# Patient Record
Sex: Female | Born: 1965
Health system: Southern US, Community
[De-identification: ages and names within clinical notes are randomized; demographics above are authoritative.]

## PROBLEM LIST (undated history)

## (undated) DIAGNOSIS — F329 Major depressive disorder, single episode, unspecified: Secondary | ICD-10-CM

## (undated) DIAGNOSIS — E669 Obesity, unspecified: Secondary | ICD-10-CM

## (undated) DIAGNOSIS — T7840XA Allergy, unspecified, initial encounter: Secondary | ICD-10-CM

## (undated) DIAGNOSIS — D649 Anemia, unspecified: Secondary | ICD-10-CM

## (undated) DIAGNOSIS — E079 Disorder of thyroid, unspecified: Secondary | ICD-10-CM

## (undated) DIAGNOSIS — G4730 Sleep apnea, unspecified: Secondary | ICD-10-CM

## (undated) DIAGNOSIS — G43909 Migraine, unspecified, not intractable, without status migrainosus: Secondary | ICD-10-CM

## (undated) DIAGNOSIS — N6019 Diffuse cystic mastopathy of unspecified breast: Secondary | ICD-10-CM

## (undated) DIAGNOSIS — E785 Hyperlipidemia, unspecified: Secondary | ICD-10-CM

## (undated) DIAGNOSIS — F419 Anxiety disorder, unspecified: Secondary | ICD-10-CM

## (undated) DIAGNOSIS — M797 Fibromyalgia: Secondary | ICD-10-CM

## (undated) DIAGNOSIS — E039 Hypothyroidism, unspecified: Secondary | ICD-10-CM

## (undated) DIAGNOSIS — H269 Unspecified cataract: Secondary | ICD-10-CM

## (undated) DIAGNOSIS — M1990 Unspecified osteoarthritis, unspecified site: Secondary | ICD-10-CM

## (undated) DIAGNOSIS — F32A Depression, unspecified: Secondary | ICD-10-CM

## (undated) DIAGNOSIS — K219 Gastro-esophageal reflux disease without esophagitis: Secondary | ICD-10-CM

## (undated) DIAGNOSIS — M810 Age-related osteoporosis without current pathological fracture: Secondary | ICD-10-CM

## (undated) HISTORY — DX: Gastro-esophageal reflux disease without esophagitis: K21.9

## (undated) HISTORY — PX: WISDOM TOOTH EXTRACTION: SHX21

## (undated) HISTORY — DX: Hyperlipidemia, unspecified: E78.5

## (undated) HISTORY — DX: Depression, unspecified: F32.A

## (undated) HISTORY — DX: Allergy, unspecified, initial encounter: T78.40XA

## (undated) HISTORY — DX: Diffuse cystic mastopathy of unspecified breast: N60.19

## (undated) HISTORY — DX: Unspecified cataract: H26.9

## (undated) HISTORY — DX: Anxiety disorder, unspecified: F41.9

## (undated) HISTORY — DX: Unspecified osteoarthritis, unspecified site: M19.90

## (undated) HISTORY — DX: Age-related osteoporosis without current pathological fracture: M81.0

## (undated) HISTORY — DX: Sleep apnea, unspecified: G47.30

## (undated) HISTORY — DX: Disorder of thyroid, unspecified: E07.9

## (undated) HISTORY — DX: Anemia, unspecified: D64.9

## (undated) HISTORY — PX: TONSILLECTOMY AND ADENOIDECTOMY: SUR1326

## (undated) HISTORY — DX: Fibromyalgia: M79.7

## (undated) HISTORY — DX: Major depressive disorder, single episode, unspecified: F32.9

## (undated) HISTORY — DX: Migraine, unspecified, not intractable, without status migrainosus: G43.909

## (undated) HISTORY — PX: GASTRIC BYPASS: SHX52

## (undated) HISTORY — DX: Obesity, unspecified: E66.9

---

## 1999-12-04 ENCOUNTER — Other Ambulatory Visit: Admission: RE | Admit: 1999-12-04 | Discharge: 1999-12-04 | Payer: Self-pay | Admitting: *Deleted

## 2000-12-08 ENCOUNTER — Other Ambulatory Visit: Admission: RE | Admit: 2000-12-08 | Discharge: 2000-12-08 | Payer: Self-pay | Admitting: *Deleted

## 2001-12-12 ENCOUNTER — Other Ambulatory Visit: Admission: RE | Admit: 2001-12-12 | Discharge: 2001-12-12 | Payer: Self-pay | Admitting: *Deleted

## 2002-03-03 ENCOUNTER — Ambulatory Visit (HOSPITAL_COMMUNITY): Admission: RE | Admit: 2002-03-03 | Discharge: 2002-03-03 | Payer: Self-pay | Admitting: Internal Medicine

## 2002-09-08 ENCOUNTER — Encounter: Payer: Self-pay | Admitting: Emergency Medicine

## 2002-09-08 ENCOUNTER — Emergency Department (HOSPITAL_COMMUNITY): Admission: EM | Admit: 2002-09-08 | Discharge: 2002-09-08 | Payer: Self-pay | Admitting: Emergency Medicine

## 2002-09-14 HISTORY — PX: LAPAROSCOPIC CHOLECYSTECTOMY: SUR755

## 2002-09-27 ENCOUNTER — Observation Stay (HOSPITAL_COMMUNITY): Admission: RE | Admit: 2002-09-27 | Discharge: 2002-09-28 | Payer: Self-pay | Admitting: *Deleted

## 2002-09-27 ENCOUNTER — Encounter (INDEPENDENT_AMBULATORY_CARE_PROVIDER_SITE_OTHER): Payer: Self-pay | Admitting: Specialist

## 2002-09-27 ENCOUNTER — Encounter: Payer: Self-pay | Admitting: *Deleted

## 2002-12-01 ENCOUNTER — Other Ambulatory Visit: Admission: RE | Admit: 2002-12-01 | Discharge: 2002-12-01 | Payer: Self-pay | Admitting: *Deleted

## 2003-12-13 ENCOUNTER — Other Ambulatory Visit: Admission: RE | Admit: 2003-12-13 | Discharge: 2003-12-13 | Payer: Self-pay | Admitting: *Deleted

## 2004-05-15 ENCOUNTER — Encounter: Admission: RE | Admit: 2004-05-15 | Discharge: 2004-08-13 | Payer: Self-pay | Admitting: Nurse Practitioner

## 2005-01-27 ENCOUNTER — Other Ambulatory Visit: Admission: RE | Admit: 2005-01-27 | Discharge: 2005-01-27 | Payer: Self-pay | Admitting: *Deleted

## 2005-03-16 ENCOUNTER — Encounter: Admission: RE | Admit: 2005-03-16 | Discharge: 2005-03-16 | Payer: Self-pay | Admitting: *Deleted

## 2006-01-18 ENCOUNTER — Other Ambulatory Visit: Admission: RE | Admit: 2006-01-18 | Discharge: 2006-01-18 | Payer: Self-pay | Admitting: Obstetrics and Gynecology

## 2006-03-19 ENCOUNTER — Encounter: Admission: RE | Admit: 2006-03-19 | Discharge: 2006-03-19 | Payer: Self-pay | Admitting: Obstetrics and Gynecology

## 2007-01-21 ENCOUNTER — Other Ambulatory Visit: Admission: RE | Admit: 2007-01-21 | Discharge: 2007-01-21 | Payer: Self-pay | Admitting: Obstetrics and Gynecology

## 2007-03-21 ENCOUNTER — Encounter: Admission: RE | Admit: 2007-03-21 | Discharge: 2007-03-21 | Payer: Self-pay | Admitting: Obstetrics & Gynecology

## 2007-09-15 HISTORY — PX: HERNIA REPAIR: SHX51

## 2008-01-31 ENCOUNTER — Other Ambulatory Visit: Admission: RE | Admit: 2008-01-31 | Discharge: 2008-01-31 | Payer: Self-pay | Admitting: Obstetrics & Gynecology

## 2008-03-21 ENCOUNTER — Encounter: Admission: RE | Admit: 2008-03-21 | Discharge: 2008-03-21 | Payer: Self-pay | Admitting: Obstetrics and Gynecology

## 2008-07-09 ENCOUNTER — Ambulatory Visit: Payer: Self-pay | Admitting: Vascular Surgery

## 2009-03-22 ENCOUNTER — Encounter: Admission: RE | Admit: 2009-03-22 | Discharge: 2009-03-22 | Payer: Self-pay | Admitting: Obstetrics and Gynecology

## 2010-04-02 ENCOUNTER — Encounter: Admission: RE | Admit: 2010-04-02 | Discharge: 2010-04-02 | Payer: Self-pay | Admitting: Obstetrics and Gynecology

## 2010-09-02 LAB — HM DEXA SCAN

## 2011-01-27 NOTE — Procedures (Signed)
DUPLEX DEEP VENOUS EXAM - LOWER EXTREMITY   INDICATION:  Right lower extremity lump with fever.   HISTORY:  Edema:  No.  Trauma/Surgery:  No.  Pain:  No.  PE:  No.  Previous DVT:  No.  Anticoagulants:  No.  Other:  Right lump on thigh for approximately one year.   DUPLEX EXAM:                CFV   SFV   PopV  PTV    GSV                R  L  R  L  R  L  R   L  R  L  Thrombosis    o  o  o     o     o      o  Spontaneous   +  +  +     +     +      +  Phasic        +  +  +     +     +      +  Augmentation  +  +  +     +     +      +  Compressible  +  +  +     +     +      +  Competent     +  +  +     +     +      +   Legend:  + - yes  o - no  p - partial  D - decreased    IMPRESSION:  1. No evidence of deep venous thrombosis or superficial venous      thrombosis in the right lower extremity deep or the left common      femoral vein.  2. Incidental note:  Evidence of nonvascularized lump in the right      thigh measuring 1.79 cm X 3.39 cm.       _____________________________  Janetta Hora. Fields, MD   AS/MEDQ  D:  07/09/2008  T:  07/09/2008  Job:  [id number]

## 2011-01-30 NOTE — Op Note (Signed)
NAME:  Katherine Todd, Katherine Todd                       ACCOUNT NO.:  [id number]   MEDICAL RECORD NO.:  [id number]                   PATIENT TYPE:  OBV   LOCATION:  0349                                 FACILITY:  [facility]   PHYSICIAN:  Maisie Fus B. Samuella Cota, M.D.               DATE OF BIRTH:  1966-01-21   DATE OF PROCEDURE:  09/27/2002  DATE OF DISCHARGE:                                 OPERATIVE REPORT   CCS#:  [id number]   PREOPERATIVE DIAGNOSES:  Chronic cholecystitis with cholelithiasis.   POSTOPERATIVE DIAGNOSES:  Chronic cholecystitis with cholelithiasis.   OPERATION PERFORMED:  Laparoscopic cholecystectomy with operative  cholangiogram.   SURGEON:  Maisie Fus B. Samuella Cota, M.D.   ANESTHESIA:  General, anesthesiologist and CRNA.   DESCRIPTION OF PROCEDURE:  The patient was taken to the operating room and  placed on the table in supine position. After satisfactory general  anesthetic with intubation the entire abdomen was prepped and draped in a  sterile field. A 0.25% Marcaine without epinephrine was injected at the  infraumbilical area. A vertical incision was made through the skin and  subcutaneous tissue. The patient was quite thick and the fascia was finally  exposed and opened and a finger was placed into the peritoneal cavity. A  pursestring suture of #0 Vicryl was placed and the Hasson trocar was placed  into the abdomen and the abdomen was insufflated to 14 mmHg. A second 10 mm  trocar was placed just to the right of midline in the subxiphoid area. Two 5  mm trocars were placed laterally. The patient's gallbladder was placed on  traction, there were minimal adhesions to the lower part of the gallbladder  which were taken down. The cystic duct was identified and seen to be normal  size. There was a small artery coming off superiorly which was doubly  clipped on the remaining side and once on the gallbladder side and divided.  After the cystic duct had been well exposed, the cystic duct was  clipped on  the gallbladder side. A small opening was made into the cystic duct. There  seemed to be some debris which was compressed and removed. [facility]  cholangiocath was introduced through a stab wound in the right upper  quadrant of the abdomen. The catheter was placed into the cystic duct and  held in place with an Endoclip. Using real-time C-arm fluoroscopy,  cholangiogram was carried out. There was good filling of the entire biliary  system with good spillage of the contrast into the duodenum. The cystic duct  appeared fairly long. After the cholangiogram had been done, the  cholangiocath was removed and the cystic duct was triply clipped on the  remaining side and divided. The cystic artery was then readily seen which  was triply clipped on the remaining side, once on the gallbladder side and  divided. The gallbladder was dissected from the bed using the cautery. There  was another  artery higher up which was doubly clipped on the remaining side  and divided. The gallbladder was freed from the liver and there was no  bleeding of the gallbladder bed. The gallbladder was then easily removed  through the infraumbilical incision. She seemed to have one stone which was  about 5 mm in size. The right upper quadrant was copiously irrigated and the  irrigant was clear. The two lateral trocars were removed under direct  vision. The pursestring suture at the infraumbilical incision was tied but  there still seemed to be a slight fascial weakness so a figure-of-eight  suture was placed into the fascia to get good closure. This seemed to  produce a good closure. The two lateral trocars were removed under direct  vision. The abdomen was deflated and a second 10 mm trocar was removed. All  trocar sites had been injected with 0.25% Marcaine without epinephrine. The  two midline incisions were closed with running subcuticular sutures of 4-0  Monocryl and the two lateral trocar sites were  closed  with single simple inverted subcuticular 4-0 Monocryl. Benzoin and  1/2 inch Steri-Strips were used to reinforce the skin closure. Dry sterile  dressings were applied. The patient seemed to tolerate the procedure well  and was taken to the PACU in satisfactory condition.                                               Thomas B. Samuella Cota, M.D.    TBP/MEDQ  D:  09/27/2002  T:  09/27/2002  Job:  [id number]   cc:   Barry Dienes. Eloise Harman, M.D.  [id number]  [place]  [place] [address]  Fax: [phone]

## 2011-01-30 NOTE — Consult Note (Signed)
NAME:  Katherine Todd, Katherine Todd                       ACCOUNT NO.:  [id number]   MEDICAL RECORD NO.:  [id number]                   PATIENT TYPE:  EMS   LOCATION:  MAJO                                 FACILITY:  [facility]   PHYSICIAN:  Thornton Park. Daphine Deutscher, M.D.             DATE OF BIRTH:  05-Jan-1966   DATE OF CONSULTATION:  DATE OF DISCHARGE:                                   CONSULTATION   CHIEF COMPLAINT:  Mid-abdominal pain off and on over the last several weeks  beginning December 4-5 and then Christmas Eve and Christmas Day.  The pain  is in the midepigastrium radiating into her back.   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  She had an attack on  December 24 and December 25 and presented to the emergency room.  A workup  was undertaken, which I have reviewed, including her ultrasound and labs.  Her hemoglobin is 13.9 with a white count of 6100 with a normal  differential.  Her electrolytes are normal.  Creatinine 1.  Liver function  studies are all in normal limits including and alkaline phosphatase of 95  and a total bilirubin of 0.6.  Amylase was normal at 40, lipase was normal  at 29.  X-rays showed at least one stone in the neck of her gallbladder but  no wall thickening and no pericholecystic fluid was noted.   PAST MEDICAL HISTORY:  Remarkable for depression, fibromyalgia, sleep apnea.  She is followed by Dr. Jarome Matin.   PAST SURGICAL HISTORY:  A tonsillectomy.   MEDICATIONS:  She has multiple medications including:  1. Voltaren 75 mg h.s.  2. Prilosec q.d.  3. Wellbutrin 300 mg q.d.  4. Prozac 20 mg 3 in the morning.  5. Provigil q.d.  6. Serzone 200 mg b.i.d.  7. Topamax 100 mg h.s.  8. Lipitor 20 mg q.o.d.  9. One other medicine that I cannot read.   PHYSICAL EXAMINATION:  GENERAL:  A slightly overweight white female in no  acute distress.  HEENT:  No scleral icterus.  LUNGS:  Clear.  HEART:  Sinus rhythm.  ABDOMEN:  There is no rebound or guarding.  She had some mild tenderness  apparently during her gallbladder ultrasound but none is localized now.    IMPRESSION:  Symptomatic gallstones.  Recommend laparoscopic  cholecystectomy, probable intraoperative cholangiogram electively.  We will  give her our office number to call about setting up for an appointment for a  laparoscopic cholecystectomy.                                               Thornton Park Daphine Deutscher, M.D.    MBM/MEDQ  D:  09/08/2002  T:  09/09/2002  Job:  [id number]

## 2011-03-02 ENCOUNTER — Other Ambulatory Visit: Payer: Self-pay | Admitting: Obstetrics and Gynecology

## 2011-03-02 DIAGNOSIS — Z1231 Encounter for screening mammogram for malignant neoplasm of breast: Secondary | ICD-10-CM

## 2011-04-06 ENCOUNTER — Ambulatory Visit: Payer: Self-pay

## 2011-04-10 ENCOUNTER — Ambulatory Visit
Admission: RE | Admit: 2011-04-10 | Discharge: 2011-04-10 | Disposition: A | Discharge status: Home / Self Care | Payer: 59 | Source: Ambulatory Visit | Attending: Obstetrics and Gynecology | Admitting: Obstetrics and Gynecology

## 2011-04-10 DIAGNOSIS — Z1231 Encounter for screening mammogram for malignant neoplasm of breast: Secondary | ICD-10-CM

## 2011-04-14 ENCOUNTER — Other Ambulatory Visit: Payer: Self-pay | Admitting: Obstetrics and Gynecology

## 2011-04-14 DIAGNOSIS — R928 Other abnormal and inconclusive findings on diagnostic imaging of breast: Secondary | ICD-10-CM

## 2011-04-21 ENCOUNTER — Ambulatory Visit
Admission: RE | Admit: 2011-04-21 | Discharge: 2011-04-21 | Disposition: A | Discharge status: Home / Self Care | Payer: 59 | Source: Ambulatory Visit | Attending: Obstetrics and Gynecology | Admitting: Obstetrics and Gynecology

## 2011-04-21 DIAGNOSIS — R928 Other abnormal and inconclusive findings on diagnostic imaging of breast: Secondary | ICD-10-CM

## 2012-02-25 LAB — HM PAP SMEAR: HM Pap smear: NEGATIVE

## 2012-03-22 ENCOUNTER — Other Ambulatory Visit: Payer: Self-pay | Admitting: Obstetrics and Gynecology

## 2012-03-22 DIAGNOSIS — Z1231 Encounter for screening mammogram for malignant neoplasm of breast: Secondary | ICD-10-CM

## 2012-04-11 ENCOUNTER — Ambulatory Visit
Admission: RE | Admit: 2012-04-11 | Discharge: 2012-04-11 | Disposition: A | Discharge status: Home / Self Care | Payer: 59 | Source: Ambulatory Visit | Attending: Obstetrics and Gynecology | Admitting: Obstetrics and Gynecology

## 2012-04-11 DIAGNOSIS — Z1231 Encounter for screening mammogram for malignant neoplasm of breast: Secondary | ICD-10-CM

## 2012-04-13 ENCOUNTER — Other Ambulatory Visit: Payer: Self-pay | Admitting: Obstetrics and Gynecology

## 2012-04-13 DIAGNOSIS — R928 Other abnormal and inconclusive findings on diagnostic imaging of breast: Secondary | ICD-10-CM

## 2012-04-18 ENCOUNTER — Ambulatory Visit
Admission: RE | Admit: 2012-04-18 | Discharge: 2012-04-18 | Disposition: A | Discharge status: Home / Self Care | Payer: 59 | Source: Ambulatory Visit | Attending: Obstetrics and Gynecology | Admitting: Obstetrics and Gynecology

## 2012-04-18 DIAGNOSIS — R928 Other abnormal and inconclusive findings on diagnostic imaging of breast: Secondary | ICD-10-CM

## 2012-05-06 DIAGNOSIS — G43009 Migraine without aura, not intractable, without status migrainosus: Secondary | ICD-10-CM | POA: Insufficient documentation

## 2013-01-25 ENCOUNTER — Telehealth: Payer: Self-pay | Admitting: Nurse Practitioner

## 2013-01-25 NOTE — Telephone Encounter (Signed)
She called for a specific prescription for low estro.

## 2013-01-26 NOTE — Telephone Encounter (Signed)
Spoke with pt about refill of lo loestrin FE. Pt has Aex scheduled for 02-28-13 and needs a refill to get her through until then. [facility] on [address] and [address]

## 2013-01-27 MED ORDER — NORETHIN-ETH ESTRAD-FE BIPHAS 1 MG-10 MCG / 10 MCG PO TABS: 1.0000 | ORAL_TABLET | Freq: Every day | ORAL | Status: DC

## 2013-01-27 NOTE — Telephone Encounter (Signed)
Rx refill for Lo Loestrin FE sent to [facility]

## 2013-01-27 NOTE — Telephone Encounter (Signed)
Ok to refill X 2 months until AEX.

## 2013-02-21 ENCOUNTER — Encounter: Payer: Self-pay | Admitting: *Deleted

## 2013-02-28 ENCOUNTER — Encounter: Payer: Self-pay | Admitting: Nurse Practitioner

## 2013-02-28 ENCOUNTER — Ambulatory Visit (INDEPENDENT_AMBULATORY_CARE_PROVIDER_SITE_OTHER): Payer: BC Managed Care – PPO | Admitting: Nurse Practitioner

## 2013-02-28 VITALS — BP 124/60 | HR 72 | Resp 14 | Ht 65.0 in | Wt 183.0 lb

## 2013-02-28 DIAGNOSIS — Z01419 Encounter for gynecological examination (general) (routine) without abnormal findings: Secondary | ICD-10-CM

## 2013-02-28 MED ORDER — NORETHIN-ETH ESTRAD-FE BIPHAS 1 MG-10 MCG / 10 MCG PO TABS: 1.0000 | ORAL_TABLET | Freq: Every day | ORAL | Status: DC

## 2013-02-28 NOTE — Patient Instructions (Signed)

## 2013-02-28 NOTE — Progress Notes (Signed)
47 y.o. G0P0 Single Caucasian Fe here for annual exam.  Usually no spotting on Lo- Loestrin and really likes this OCP.  No vaso symptoms. She is not sexually active. She is now working at [employer] after leaving job at [employer].  Patient's last menstrual period was 10/27/2010.          Sexually active: no  The current method of family planning is none.    Exercising: no  The patient does not participate in regular exercise at present. Smoker:  no  Health Maintenance: Pap:  02/25/2012  Normal with negative HR HPV MMG:  04/18/2012 normal Colonoscopy:  never BMD:   09/02/2010 normal TDaP:  01/2010 Labs: PCP monitors labs (blood work). Pt denied urine analysis.    reports that she has never smoked. She has never used smokeless tobacco. She reports that she does not drink alcohol or use illicit drugs.  Past Medical History  Diagnosis Date  . Depression   . Fibrocystic breast   . Fibromyalgia   . Sleep apnea   . Obesity   . Migraines     Past Surgical History  Procedure Laterality Date  . Wisdom tooth extraction    . Laparoscopic cholecystectomy    . Gastric bypass    . Hernia repair      incisional repair    Current Outpatient Prescriptions  Medication Sig Dispense Refill  . ARIPiprazole (ABILIFY) 2 MG tablet Take 2 mg by mouth daily.      . calcium carbonate (TUMS - DOSED IN MG ELEMENTAL CALCIUM) 500 MG chewable tablet Chew 1 tablet by mouth 2 (two) times daily.      . cetirizine (ZYRTEC) 10 MG tablet Take 10 mg by mouth daily.      . clonazePAM (KLONOPIN) 0.5 MG tablet Take 0.5 mg by mouth 2 (two) times daily as needed for anxiety.      Marland Kitchen eletriptan (RELPAX) 40 MG tablet One tablet by mouth at onset of headache. May repeat in 2 hours if headache persists or recurs. may repeat in 2 hours if necessary      . fenofibrate 160 MG tablet Take 160 mg by mouth daily.      . ferrous fumarate (HEMOCYTE - 106 MG FE) 325 (106 FE) MG TABS Take 1 tablet by mouth daily.      Marland Kitchen  lovastatin (MEVACOR) 20 MG tablet Take 20 mg by mouth once a week.      . Multiple Vitamin (MULTIVITAMIN) tablet Take 1 tablet by mouth daily.      . Norethindrone-Ethinyl Estradiol-Fe Biphas (LO LOESTRIN FE) 1 MG-10 MCG / 10 MCG tablet Take 1 tablet by mouth daily.  2 Package  0  . omeprazole (PRILOSEC) 20 MG capsule Take 20 mg by mouth 2 (two) times daily.      Marland Kitchen venlafaxine XR (EFFEXOR-XR) 150 MG 24 hr capsule Take 150 mg by mouth daily.      . Vitamin D, Ergocalciferol, (DRISDOL) 50000 UNITS CAPS Take 50,000 Units by mouth every 7 (seven) days.      Marland Kitchen zonisamide (ZONEGRAN) 100 MG capsule Take 100 mg by mouth daily.       No current facility-administered medications for this visit.    Family History  Problem Relation Age of Onset  . Cancer Maternal Grandfather     colon caner    ROS:  Pertinent items are noted in HPI.  Otherwise, a comprehensive ROS was negative.  Exam:   BP 124/60  Pulse 72  Resp 14  Ht 5\' 5"  (1.651 m)  Wt 183 lb (83.008 kg)  BMI 30.45 kg/m2  LMP 10/27/2010 Height: 5\' 5"  (165.1 cm)  Ht Readings from Last 3 Encounters:  02/28/13 5\' 5"  (1.651 m)    General appearance: alert, cooperative and appears stated age Head: Normocephalic, without obvious abnormality, atraumatic Neck: no adenopathy, supple, symmetrical, trachea midline and thyroid normal to inspection and palpation Lungs: clear to auscultation bilaterally Breasts: normal appearance, no masses or tenderness Heart: regular rate and rhythm Abdomen: soft, non-tender; no masses,  no organomegaly Extremities: extremities normal, atraumatic, no cyanosis or edema Skin: Skin color, texture, turgor normal. No rashes or lesions Lymph nodes: Cervical, supraclavicular, and axillary nodes normal. No abnormal inguinal nodes palpated Neurologic: Grossly normal   Pelvic: External genitalia:  no lesions              Urethra:  normal appearing urethra with no masses, tenderness or lesions              Bartholin's  and Skene's: normal                 Vagina: normal appearing vagina with normal color and discharge, no lesions              Cervix: anteverted              Pap taken: no Bimanual Exam:  Uterus:  normal size, contour, position, consistency, mobility, non-tender              Adnexa: no mass, fullness, tenderness               Rectovaginal: Confirms               Anus:  normal sphincter tone, no lesions  A:  Well Woman with normal exam  S/P mini gastric bypass 04/2007 with revision in 02/2010  Peri menopausal  P:   Pap smear as per guidelines   Mammogram due 04/2013  counseled on adequate intake of calcium and vitamin D,   diet and exercise, Kegel's exercises return annually or prn  An After Visit Summary was printed and given to the patient.

## 2013-03-02 NOTE — Progress Notes (Signed)
Encounter reviewed by Dr. Brook Silva.  

## 2013-03-22 ENCOUNTER — Other Ambulatory Visit: Payer: Self-pay | Admitting: Certified Nurse Midwife

## 2013-03-23 NOTE — Telephone Encounter (Signed)
eScribe request for refill on LoLoestrin Last filled - 02/28/13 x 1 year Last AEX - 02/28/13 RX was refilled at [facility]. RX denied.

## 2013-03-27 ENCOUNTER — Other Ambulatory Visit: Payer: Self-pay

## 2013-03-27 DIAGNOSIS — Z1231 Encounter for screening mammogram for malignant neoplasm of breast: Secondary | ICD-10-CM

## 2013-04-19 ENCOUNTER — Ambulatory Visit
Admission: RE | Admit: 2013-04-19 | Discharge: 2013-04-19 | Disposition: A | Discharge status: Home / Self Care | Payer: BC Managed Care – PPO | Source: Ambulatory Visit

## 2013-04-19 DIAGNOSIS — Z1231 Encounter for screening mammogram for malignant neoplasm of breast: Secondary | ICD-10-CM

## 2013-12-14 ENCOUNTER — Other Ambulatory Visit: Payer: Self-pay | Admitting: Nurse Practitioner

## 2013-12-19 ENCOUNTER — Telehealth: Payer: Self-pay | Admitting: Nurse Practitioner

## 2013-12-19 NOTE — Telephone Encounter (Signed)
Last AEX 02/28/2013 Next appt 03/02/2014  Last refill 02/28/2013 #3 packs/3 refills - Called pharmacy and pt has enough refill until 02/2014 - Patient notified.

## 2013-12-19 NOTE — Telephone Encounter (Signed)
Patient needs refill of Norethindrone-Ethinyl Estradiol-Fe Biphas (LO LOESTRIN FE) 1 MG-10 MCG / 10 MCG tablet  Take 1 tablet by mouth daily., Starting 02/28/2013, Until Discontinued, Print, Last Dose: Not Recorded  Refills: 3 ordered Pharmacy: [id number] - [place], [address] AT [address]

## 2014-01-22 ENCOUNTER — Encounter: Payer: Self-pay | Admitting: Nurse Practitioner

## 2014-03-02 ENCOUNTER — Ambulatory Visit: Payer: BC Managed Care – PPO | Admitting: Nurse Practitioner

## 2014-03-04 ENCOUNTER — Other Ambulatory Visit: Payer: Self-pay | Admitting: Nurse Practitioner

## 2014-03-05 NOTE — Telephone Encounter (Signed)
Last AEX 02/28/13 AEX scheduled for 03/20/14 Lo Loestrin #84/0refills sent to pharmacy to last patient until AEX

## 2014-03-20 ENCOUNTER — Ambulatory Visit (INDEPENDENT_AMBULATORY_CARE_PROVIDER_SITE_OTHER): Payer: BC Managed Care – PPO | Admitting: Nurse Practitioner

## 2014-03-20 ENCOUNTER — Encounter: Payer: Self-pay | Admitting: Nurse Practitioner

## 2014-03-20 ENCOUNTER — Other Ambulatory Visit: Payer: Self-pay

## 2014-03-20 VITALS — BP 110/74 | HR 64 | Ht 64.75 in | Wt 170.0 lb

## 2014-03-20 DIAGNOSIS — Z01419 Encounter for gynecological examination (general) (routine) without abnormal findings: Secondary | ICD-10-CM

## 2014-03-20 DIAGNOSIS — E559 Vitamin D deficiency, unspecified: Secondary | ICD-10-CM

## 2014-03-20 DIAGNOSIS — Z1231 Encounter for screening mammogram for malignant neoplasm of breast: Secondary | ICD-10-CM

## 2014-03-20 MED ORDER — NORETHIN-ETH ESTRAD-FE BIPHAS 1 MG-10 MCG / 10 MCG PO TABS: ORAL_TABLET | ORAL | Status: DC

## 2014-03-20 NOTE — Patient Instructions (Signed)

## 2014-03-20 NOTE — Progress Notes (Signed)
Patient ID: Katherine Todd, female   DOB: 06-20-1966, 48 y.o.   MRN: [id number] 48 y.o. G0P0 Single Caucasian Fe here for annual exam.  No new health problems. menses is regular and light for 2-3 days .  Remains not SA.  Recent Vit D was  29 about 2 weeks ago.  PCP is managing.  Her goal weight is 150 lb.  Since last year has lost 13 lbs.  Since the gastric bypass in 8/08 and revision 02/2010 her weight was 306 lbs. intially and now at 170 lbs. = 136 lbs total weight loss.  Patient's last menstrual period was 10/27/2010.          Sexually active: No.  The current method of family planning is OCP (estrogen/progesterone).    Exercising: no The patient does not participate in regular exercise at present.  Smoker: no   Health Maintenance:  Pap: 02/25/2012 Normal with negative HR HPV  MMG: 04/19/13, Bi-Rads 2: benign findings BMD: 2013, [facility], normal per patient TDaP: 01/2010  Labs: PCP monitors labs (blood work).   reports that she has never smoked. She has never used smokeless tobacco. She reports that she does not drink alcohol or use illicit drugs.  Past Medical History  Diagnosis Date  . Depression   . Fibrocystic breast   . Fibromyalgia   . Sleep apnea   . Obesity   . Migraines     Past Surgical History  Procedure Laterality Date  . Wisdom tooth extraction  age 54  . Laparoscopic cholecystectomy  09/2002    Dr. March Rummage  . Gastric bypass  04/2007, revision 02/2010    "Mini" gastric bypass  . Hernia repair  2009    incisional repair from gastsric bypass  . Tonsillectomy and adenoidectomy  age  58    Current Outpatient Prescriptions  Medication Sig Dispense Refill  . ARIPiprazole (ABILIFY) 5 MG tablet Take 2.5 mg by mouth daily.      Marland Kitchen BRINTELLIX 20 MG TABS Take 20 mg by mouth daily.      Marland Kitchen buPROPion (WELLBUTRIN XL) 300 MG 24 hr tablet Take 1 tablet by mouth daily.      . calcium carbonate (TUMS - DOSED IN MG ELEMENTAL CALCIUM) 500 MG chewable tablet Chew 1 tablet by mouth  2 (two) times daily.      . cetirizine (ZYRTEC) 10 MG tablet Take 10 mg by mouth daily.      . clonazePAM (KLONOPIN) 0.5 MG tablet Take 0.5 mg by mouth 2 (two) times daily as needed for anxiety.      Marland Kitchen eletriptan (RELPAX) 40 MG tablet One tablet by mouth at onset of headache. May repeat in 2 hours if headache persists or recurs. may repeat in 2 hours if necessary      . fenofibrate 160 MG tablet Take 160 mg by mouth daily.      . ferrous fumarate (HEMOCYTE - 106 MG FE) 325 (106 FE) MG TABS Take 1 tablet by mouth daily.      Marland Kitchen lovastatin (MEVACOR) 20 MG tablet Take 20 mg by mouth once a week.      . meloxicam (MOBIC) 15 MG tablet Take 1 tablet by mouth daily.      . Multiple Vitamin (MULTIVITAMIN) tablet Take 1 tablet by mouth daily.      . Norethindrone-Ethinyl Estradiol-Fe Biphas (LO LOESTRIN FE) 1 MG-10 MCG / 10 MCG tablet TAKE 1 TABLET BY MOUTH EVERY DAY  3 Package  3  . Vitamin  D, Ergocalciferol, (DRISDOL) 50000 UNITS CAPS Take 50,000 Units by mouth. Five days per week      . zonisamide (ZONEGRAN) 100 MG capsule Take 100 mg by mouth daily.       No current facility-administered medications for this visit.    Family History  Problem Relation Age of Onset  . Colon cancer Maternal Grandfather     colon caner  . Hyperlipidemia Mother   . Prostate cancer Father 38  . Thyroid disease Father   . Melanoma Brother 35    back of knee     ROS:  Pertinent items are noted in HPI.  Otherwise, a comprehensive ROS was negative.  Exam:   BP 110/74  Pulse 64  Ht 5' 4.75" (1.645 m)  Wt 170 lb (77.111 kg)  BMI 28.50 kg/m2  LMP 10/27/2010 Height: 5' 4.75" (164.5 cm)  Ht Readings from Last 3 Encounters:  03/20/14 5' 4.75" (1.645 m)  02/28/13 5\' 5"  (1.651 m)    General appearance: alert, cooperative and appears stated age Head: Normocephalic, without obvious abnormality, atraumatic Neck: no adenopathy, supple, symmetrical, trachea midline and thyroid normal to inspection and palpation Lungs:  clear to auscultation bilaterally Breasts: normal appearance, no masses or tenderness Heart: regular rate and rhythm Abdomen: soft, non-tender; no masses,  no organomegaly Extremities: extremities normal, atraumatic, no cyanosis or edema Skin: Skin color, texture, turgor normal. No rashes or lesions Lymph nodes: Cervical, supraclavicular, and axillary nodes normal. No abnormal inguinal nodes palpated Neurologic: Grossly normal   Pelvic: External genitalia:  no lesions              Urethra:  normal appearing urethra with no masses, tenderness or lesions              Bartholin's and Skene's: normal                 Vagina: normal appearing vagina with normal color and discharge, no lesions              Cervix: anteverted              Pap taken: No. Bimanual Exam:  Uterus:  normal size, contour, position, consistency, mobility, non-tender              Adnexa: no mass, fullness, tenderness               Rectovaginal: Confirms               Anus:  normal sphincter tone, no lesions  A:  Well Woman with normal exam  OCP for cycle regulation  S/P gastric bypass 8/08 with revision 02/2010  History of depression    P:   Reviewed health and wellness pertinent to exam  Pap smear not taken today  Mammogram is due 04/2014  Refill OCP Lo Loestrin for a year  Counseled on breast self exam, mammography screening, adequate intake of calcium and vitamin D, diet and exercise return annually or prn  An After Visit Summary was printed and given to the patient.

## 2014-03-21 NOTE — Progress Notes (Signed)
Encounter reviewed by Dr. Dain Laseter Silva.  

## 2014-04-20 ENCOUNTER — Ambulatory Visit
Admission: RE | Admit: 2014-04-20 | Discharge: 2014-04-20 | Disposition: A | Discharge status: Home / Self Care | Payer: BC Managed Care – PPO | Source: Ambulatory Visit

## 2014-04-20 ENCOUNTER — Encounter (INDEPENDENT_AMBULATORY_CARE_PROVIDER_SITE_OTHER): Payer: Self-pay

## 2014-04-20 DIAGNOSIS — Z1231 Encounter for screening mammogram for malignant neoplasm of breast: Secondary | ICD-10-CM

## 2014-10-25 ENCOUNTER — Telehealth: Payer: Self-pay

## 2014-10-25 NOTE — Telephone Encounter (Signed)
Spoke with Lattie Haw at [facility] to initiate prior authorization for [employer] Form to be faxed to office.

## 2014-10-26 NOTE — Telephone Encounter (Signed)
Prior authorization form to Milford Cage, FNP for review and signature before fax.

## 2014-10-29 NOTE — Telephone Encounter (Signed)
Reviewed and signed

## 2014-10-30 NOTE — Telephone Encounter (Signed)
Prior authorization faxed to [facility] at [phone] with fax cover sheet and confirmation.

## 2014-11-01 NOTE — Telephone Encounter (Signed)
Prior authorization for Lo Loestrin Fe was approved from 10/30/2014-09/13/2038. Spoke with patient and advised of approval. Patient is agreeable. Approval to Milford Cage, FNP for review and signature for scan.  Routing to provider for final review. Patient agreeable to disposition. Will close encounter

## 2014-12-27 ENCOUNTER — Telehealth: Payer: Self-pay | Admitting: Nurse Practitioner

## 2014-12-27 NOTE — Telephone Encounter (Signed)
Left message regarding upcoming appointment has been canceled and needs to be rescheduled. °

## 2015-03-15 ENCOUNTER — Other Ambulatory Visit: Payer: Self-pay

## 2015-03-15 DIAGNOSIS — Z1231 Encounter for screening mammogram for malignant neoplasm of breast: Secondary | ICD-10-CM

## 2015-03-28 ENCOUNTER — Ambulatory Visit: Payer: BC Managed Care – PPO | Admitting: Nurse Practitioner

## 2015-04-02 ENCOUNTER — Ambulatory Visit (INDEPENDENT_AMBULATORY_CARE_PROVIDER_SITE_OTHER): Payer: BLUE CROSS/BLUE SHIELD | Admitting: Nurse Practitioner

## 2015-04-02 ENCOUNTER — Encounter: Payer: Self-pay | Admitting: Nurse Practitioner

## 2015-04-02 VITALS — BP 122/70 | HR 80 | Resp 16 | Ht 64.5 in | Wt 168.0 lb

## 2015-04-02 DIAGNOSIS — Z0000 Encounter for general adult medical examination without abnormal findings: Secondary | ICD-10-CM | POA: Diagnosis not present

## 2015-04-02 DIAGNOSIS — Z01419 Encounter for gynecological examination (general) (routine) without abnormal findings: Secondary | ICD-10-CM

## 2015-04-02 LAB — POCT URINALYSIS DIPSTICK
Bilirubin, UA: NEGATIVE
Blood, UA: NEGATIVE
Glucose, UA: NEGATIVE
Ketones, UA: NEGATIVE
Leukocytes, UA: NEGATIVE
Nitrite, UA: NEGATIVE
Protein, UA: NEGATIVE
Urobilinogen, UA: NEGATIVE
pH, UA: 5

## 2015-04-02 MED ORDER — NORETHIN-ETH ESTRAD-FE BIPHAS 1 MG-10 MCG / 10 MCG PO TABS: ORAL_TABLET | ORAL | Status: DC

## 2015-04-02 NOTE — Patient Instructions (Signed)

## 2015-04-02 NOTE — Progress Notes (Signed)
Patient ID: Katherine Todd, female   DOB: Nov 05, 1965, 49 y.o.   MRN: [id number] 49 y.o. G0P0 Single  Caucasian Fe here for annual exam.  New partner for 4 months.  (This is her first partner ever).  Since being SA she is having pain at the introitus  Maybe the next day has some soreness internally.  She is having spotting with SA that sometimes requires a mini pad and maybe last for 1 day.  She attributes this to friction rub.   No bladder or urinary symptoms.  She remains of on OCP without a cycle since 2012.  Partner who is a physician states no history of STD's for him declines testing.  Patient's last menstrual period was 10/27/2010.          Sexually active: Yes.    The current method of family planning is OCP (estrogen/progesterone) and post menopausal status.    Exercising: No.  The patient does not participate in regular exercise at present. Smoker:  no  Health Maintenance: Pap:  02/25/12: Negative MMG:  04/20/14, 3D, Bi-Rads 1:  Negative (scheduled for 04/24/15) DEXA: 2011 TDaP:  01/12/10 Labs:  PCP [id number]    reports that she has never smoked. She has never used smokeless tobacco. She reports that she does not drink alcohol or use illicit drugs.  Past Medical History  Diagnosis Date  . Depression   . Fibrocystic breast   . Fibromyalgia   . Sleep apnea   . Obesity   . Migraines     Past Surgical History  Procedure Laterality Date  . Wisdom tooth extraction  age 3  . Laparoscopic cholecystectomy  09/2002    Dr. March Rummage  . Gastric bypass  04/2007, revision 02/2010    "Mini" gastric bypass  . Hernia repair  2009    incisional repair from gastsric bypass  . Tonsillectomy and adenoidectomy  age  14    Current Outpatient Prescriptions  Medication Sig Dispense Refill  . ARIPiprazole (ABILIFY) 5 MG tablet Take 2.5 mg by mouth daily.    Marland Kitchen BRINTELLIX 20 MG TABS Take 20 mg by mouth daily.    Marland Kitchen buPROPion (WELLBUTRIN XL) 300 MG 24 hr tablet Take 1 tablet by mouth daily.    .  calcium carbonate (TUMS - DOSED IN MG ELEMENTAL CALCIUM) 500 MG chewable tablet Chew 1 tablet by mouth 2 (two) times daily.    . cetirizine (ZYRTEC) 10 MG tablet Take 10 mg by mouth daily.    . clonazePAM (KLONOPIN) 0.5 MG tablet Take 0.5 mg by mouth 2 (two) times daily as needed for anxiety.    . fenofibrate 160 MG tablet Take 160 mg by mouth daily.    . ferrous fumarate (HEMOCYTE - 106 MG FE) 325 (106 FE) MG TABS Take 1 tablet by mouth daily.    Marland Kitchen ipratropium (ATROVENT) 0.03 % nasal spray USE 2 SPRAYS IN EACH NOSTRIL TID AS NEEDED  2  . lovastatin (MEVACOR) 20 MG tablet Take 20 mg by mouth once a week.    . Multiple Vitamin (MULTIVITAMIN) tablet Take 1 tablet by mouth daily.    . mupirocin ointment (BACTROBAN) 2 % APPLY TOPICALLY TO SITE TWICE DAILY AS DIRECTED  0  . Norethindrone-Ethinyl Estradiol-Fe Biphas (LO LOESTRIN FE) 1 MG-10 MCG / 10 MCG tablet TAKE 1 TABLET BY MOUTH EVERY DAY 3 Package 3  . SUMAtriptan (IMITREX) 100 MG tablet TAKE 1 TABLET BY MOUTH ONCE AS NEEDED FOR MIGRAINE(MAY REPEAT IN 2 HORUS. MAX OF 2  TABLETS PER 24 HOURS)    . Vitamin D, Ergocalciferol, (DRISDOL) 50000 UNITS CAPS Take 50,000 Units by mouth. Five days per week     No current facility-administered medications for this visit.    Family History  Problem Relation Age of Onset  . Colon cancer Maternal Grandfather     colon caner  . Hyperlipidemia Mother   . Prostate cancer Father 20  . Thyroid disease Father   . Melanoma Brother 35    back of knee     ROS:  Pertinent items are noted in HPI.  Otherwise, a comprehensive ROS was negative.  Exam:   BP 122/70 mmHg  Pulse 80  Resp 16  Ht 5' 4.5" (1.638 m)  Wt 168 lb (76.204 kg)  BMI 28.40 kg/m2  LMP 10/27/2010 Height: 5' 4.5" (163.8 cm) Ht Readings from Last 3 Encounters:  04/02/15 5' 4.5" (1.638 m)  03/20/14 5' 4.75" (1.645 m)  02/28/13 5\' 5"  (1.651 m)    General appearance: alert, cooperative and appears stated age Head: Normocephalic, without  obvious abnormality, atraumatic Neck: no adenopathy, supple, symmetrical, trachea midline and thyroid normal to inspection and palpation Lungs: clear to auscultation bilaterally Breasts: normal appearance, no masses or tenderness Heart: regular rate and rhythm Abdomen: soft, non-tender; no masses,  no organomegaly Extremities: extremities normal, atraumatic, no cyanosis or edema Skin: Skin color, texture, turgor normal. No rashes or lesions Lymph nodes: Cervical, supraclavicular, and axillary nodes normal. No abnormal inguinal nodes palpated Neurologic: Grossly normal   Pelvic: External genitalia:  no lesions              Urethra:  normal appearing urethra with no masses, tenderness or lesions              Bartholin's and Skene's: normal                 Vagina: normal appearing vagina with normal color and discharge, no lesions or abrasions, no bleeding              Cervix: anteverted no cervical polyp              Pap taken: Yes.   Bimanual Exam:  Uterus:  normal size, contour, position, consistency, mobility, non-tender              Adnexa: no mass, fullness, tenderness               Rectovaginal: Confirms               Anus:  normal sphincter tone, no lesions  Chaperone present:  yes  A:  Well Woman with normal exam  OCP for cycle regulation and birth control  Post coital spotting  Pt declines  STD's  S/P gastric bypass 8/08 with revision 02/2010 History of depression   P:   Reviewed health and wellness pertinent to exam  Pap smear as above  Mammogram is due 04/2015 and is scheduled  Counseled on breast self exam, mammography screening, adequate intake of calcium and vitamin D, diet and exercise, Kegel's exercises return annually or prn  An After Visit Summary was printed and given to the patient.

## 2015-04-03 NOTE — Progress Notes (Signed)
Encounter reviewed by Dr. Brook Amundson C. Silva.  

## 2015-04-04 LAB — IPS PAP TEST WITH HPV

## 2015-04-24 ENCOUNTER — Ambulatory Visit
Admission: RE | Admit: 2015-04-24 | Discharge: 2015-04-24 | Disposition: A | Discharge status: Home / Self Care | Payer: BLUE CROSS/BLUE SHIELD | Source: Ambulatory Visit

## 2015-04-24 DIAGNOSIS — Z1231 Encounter for screening mammogram for malignant neoplasm of breast: Secondary | ICD-10-CM

## 2015-05-15 ENCOUNTER — Encounter: Payer: Self-pay | Admitting: Gastroenterology

## 2015-06-11 ENCOUNTER — Emergency Department (HOSPITAL_COMMUNITY)
Admission: EM | Admit: 2015-06-11 | Discharge: 2015-06-11 | Disposition: A | Discharge status: Home / Self Care | Payer: BLUE CROSS/BLUE SHIELD | Attending: Emergency Medicine | Admitting: Emergency Medicine

## 2015-06-11 ENCOUNTER — Encounter (HOSPITAL_COMMUNITY): Payer: Self-pay | Admitting: *Deleted

## 2015-06-11 DIAGNOSIS — Z79899 Other long term (current) drug therapy: Secondary | ICD-10-CM | POA: Diagnosis not present

## 2015-06-11 DIAGNOSIS — S60512A Abrasion of left hand, initial encounter: Secondary | ICD-10-CM | POA: Insufficient documentation

## 2015-06-11 DIAGNOSIS — G43909 Migraine, unspecified, not intractable, without status migrainosus: Secondary | ICD-10-CM | POA: Insufficient documentation

## 2015-06-11 DIAGNOSIS — W19XXXA Unspecified fall, initial encounter: Secondary | ICD-10-CM

## 2015-06-11 DIAGNOSIS — E669 Obesity, unspecified: Secondary | ICD-10-CM | POA: Insufficient documentation

## 2015-06-11 DIAGNOSIS — S60211A Contusion of right wrist, initial encounter: Secondary | ICD-10-CM | POA: Insufficient documentation

## 2015-06-11 DIAGNOSIS — Y9301 Activity, walking, marching and hiking: Secondary | ICD-10-CM | POA: Insufficient documentation

## 2015-06-11 DIAGNOSIS — W010XXA Fall on same level from slipping, tripping and stumbling without subsequent striking against object, initial encounter: Secondary | ICD-10-CM | POA: Insufficient documentation

## 2015-06-11 DIAGNOSIS — Z8742 Personal history of other diseases of the female genital tract: Secondary | ICD-10-CM | POA: Insufficient documentation

## 2015-06-11 DIAGNOSIS — S060X0A Concussion without loss of consciousness, initial encounter: Secondary | ICD-10-CM | POA: Insufficient documentation

## 2015-06-11 DIAGNOSIS — F329 Major depressive disorder, single episode, unspecified: Secondary | ICD-10-CM | POA: Insufficient documentation

## 2015-06-11 DIAGNOSIS — Y92481 Parking lot as the place of occurrence of the external cause: Secondary | ICD-10-CM | POA: Diagnosis not present

## 2015-06-11 DIAGNOSIS — Y998 Other external cause status: Secondary | ICD-10-CM | POA: Insufficient documentation

## 2015-06-11 DIAGNOSIS — Z8739 Personal history of other diseases of the musculoskeletal system and connective tissue: Secondary | ICD-10-CM | POA: Diagnosis not present

## 2015-06-11 DIAGNOSIS — S0990XA Unspecified injury of head, initial encounter: Secondary | ICD-10-CM | POA: Diagnosis present

## 2015-06-11 DIAGNOSIS — M25531 Pain in right wrist: Secondary | ICD-10-CM

## 2015-06-11 MED ORDER — ACETAMINOPHEN 160 MG/5ML PO ELIX: 640.0000 mg | ORAL_SOLUTION | Freq: Three times a day (TID) | ORAL | Status: DC | PRN

## 2015-06-11 MED ORDER — ACETAMINOPHEN 160 MG/5ML PO SOLN
650.0000 mg | Freq: Once | ORAL | Status: AC
  Administered 2015-06-11: 650 mg via ORAL
  Filled 2015-06-11: qty 20.3

## 2015-06-11 NOTE — ED Notes (Addendum)
Pt states "I was walking and tripped over a speed bump while checking out my [employer] card."  Pt denies LOC.  Pt provided ice pack.  Presents with abrasion to right upper leg, right hand and right forehead.

## 2015-06-11 NOTE — ED Provider Notes (Signed)
CSN: [id number]     Arrival date & time 06/11/15  1354 History  By signing my name below, I, Stephania Fragmin, attest that this documentation has been prepared under the direction and in the presence of [employer], PA-C. Electronically Signed: Stephania Fragmin, ED Scribe. 06/11/2015. 4:58 PM.      Chief Complaint  Patient presents with  . Fall  . Head Injury  . Abrasion    left thigh, right anterior & posterior hand, right forehead   The history is provided by the patient. No language interpreter was used.   HPI Comments: Katherine Todd is a 49 y.o. female who presents to the Emergency Department S/P a fall that occurred earlier today. Patient states she was walking in a parking lot while checking her [employer] card when she accidentally tripped over a speed bump, falling forward and catching herself with her hands. She denies LOC and states she was able to get up and ambulate immediately. Patient had called her PCP and was told to come to the ED for evaluation.  She denies being on any anticoagulation; she also denies a history of DM or any chronic medical conditions besides depression and anxiety. Patient does note a history of gastric bypass surgery and states she can only take liquid Tylenol, instead of Tylenol or ibuprofen tablets. She states her tetanus vaccine is UTD. She denies any visual changes, dizziness, or neck pain.   Past Medical History  Diagnosis Date  . Depression   . Fibrocystic breast   . Fibromyalgia   . Sleep apnea   . Obesity   . Migraines    Past Surgical History  Procedure Laterality Date  . Wisdom tooth extraction  age 60  . Laparoscopic cholecystectomy  09/2002    Dr. March Rummage  . Gastric bypass  04/2007, revision 02/2010    "Mini" gastric bypass  . Hernia repair  2009    incisional repair from gastsric bypass  . Tonsillectomy and adenoidectomy  age  64   Family History  Problem Relation Age of Onset  . Colon cancer Maternal Grandfather     colon caner  .  Hyperlipidemia Mother   . Prostate cancer Father 34  . Thyroid disease Father   . Melanoma Brother 35    back of knee    Social History  Substance Use Topics  . Smoking status: Never Smoker   . Smokeless tobacco: Never Used  . Alcohol Use: No   OB History    Gravida Para Term Preterm AB TAB SAB Ectopic Multiple Living   0              Review of Systems  Eyes: Negative for visual disturbance.  Gastrointestinal: Negative for abdominal pain.  Musculoskeletal: Negative for neck pain.  Skin: Positive for wound (abrasion to right forehead).   Allergies  Adhesive; Atorvastatin; and [employer] diphedryl   Home Medications   Prior to Admission medications   Medication Sig Start Date End Date Taking? Authorizing Provider  acetaminophen (TYLENOL) 160 MG/5ML elixir Take 20 mLs (640 mg total) by mouth every 8 (eight) hours as needed for fever. 06/11/15   Hanna Patel-Mills, PA-C  ARIPiprazole (ABILIFY) 5 MG tablet Take 2.5 mg by mouth daily.    Historical Provider, MD  BRINTELLIX 20 MG TABS Take 20 mg by mouth daily. 03/12/14   Historical Provider, MD  buPROPion (WELLBUTRIN XL) 300 MG 24 hr tablet Take 1 tablet by mouth daily. 03/18/14   Historical Provider, MD  calcium carbonate (  TUMS - DOSED IN MG ELEMENTAL CALCIUM) 500 MG chewable tablet Chew 1 tablet by mouth 2 (two) times daily.    Historical Provider, MD  cetirizine (ZYRTEC) 10 MG tablet Take 10 mg by mouth daily.    Historical Provider, MD  clonazePAM (KLONOPIN) 0.5 MG tablet Take 0.5 mg by mouth 2 (two) times daily as needed for anxiety.    Historical Provider, MD  fenofibrate 160 MG tablet Take 160 mg by mouth daily.    Historical Provider, MD  ferrous fumarate (HEMOCYTE - 106 MG FE) 325 (106 FE) MG TABS Take 1 tablet by mouth daily.    Historical Provider, MD  ipratropium (ATROVENT) 0.03 % nasal spray USE 2 SPRAYS IN EACH NOSTRIL TID AS NEEDED 01/16/15   Historical Provider, MD  lovastatin (MEVACOR) 20 MG tablet Take 20 mg by mouth once  a week.    Historical Provider, MD  Multiple Vitamin (MULTIVITAMIN) tablet Take 1 tablet by mouth daily.    Historical Provider, MD  mupirocin ointment (BACTROBAN) 2 % APPLY TOPICALLY TO SITE TWICE DAILY AS DIRECTED 12/27/14   Historical Provider, MD  Norethindrone-Ethinyl Estradiol-Fe Biphas (LO LOESTRIN FE) 1 MG-10 MCG / 10 MCG tablet TAKE 1 TABLET BY MOUTH EVERY DAY 04/02/15   Kem Boroughs, FNP  SUMAtriptan (IMITREX) 100 MG tablet TAKE 1 TABLET BY MOUTH ONCE AS NEEDED FOR MIGRAINE(MAY REPEAT IN 2 HORUS. MAX OF 2 TABLETS PER 24 HOURS) 01/07/15   Historical Provider, MD  Vitamin D, Ergocalciferol, (DRISDOL) 50000 UNITS CAPS Take 50,000 Units by mouth. Five days per week    Historical Provider, MD   BP 129/91 mmHg  Pulse 67  Temp(Src) 98.4 F (36.9 C) (Oral)  Resp 16  Ht 5\' 4"  (1.626 m)  Wt 164 lb (74.39 kg)  BMI 28.14 kg/m2  SpO2 100%  LMP 10/27/2010 Physical Exam  Constitutional: She is oriented to person, place, and time. She appears well-developed and well-nourished. No distress.  HENT:  Head: Normocephalic and atraumatic.  Eyes: Conjunctivae and EOM are normal.  EOMs intact. No conjunctival hemorrhage.   Neck: Neck supple. No tracheal deviation present.  Cardiovascular: Normal rate.   Pulmonary/Chest: Effort normal. No respiratory distress.  Abdominal: There is no tenderness.  No TTP.   Musculoskeletal: Normal range of motion.  MAE x 4 without difficulty. Ecchymosis to the right wrist, but no deformity. FROM of the wrist. Able to flex and extend wrist and fingers. No snuff box tenderness.   Neurological: She is alert and oriented to person, place, and time.  Skin: Skin is warm and dry.  3 cm knot on right side of forehead. It is non-tender and non-fluctuant, without surrounding erythema. Patient also has minor abrasions to volar surfaces of her bilateral hands.   Psychiatric: She has a normal mood and affect. Her behavior is normal.  Nursing note and vitals reviewed.   ED  Course  Procedures (including critical care time)  DIAGNOSTIC STUDIES: Oxygen Saturation is 100% on RA, normal by my interpretation.    COORDINATION OF CARE: 2:59 PM - Discussed treatment plan with pt at bedside which includes liquid Tylenol for pain administered here, as patient reports a history of gastric bypass and cannot take ibuprofen or Tylenol tablets. Pt verbalized understanding and agreed to plan.   MDM   Final diagnoses:  Concussion, without loss of consciousness, initial encounter  Right wrist pain  Fall, initial encounter   Patient presents for head injury and right wrist injury after trip and fall earlier today. She  denies any loss of consciousness or being on any anticoagulation medication. She denies any pain at this time. She states she called her PCP who recommended she come to the ED. I do not believe she needs imaging and have reviewed the [place] head CT rule. I discussed return precautions and concussion precautions with the patient. I also discussed follow-up with the patient and she verbally agrees with the plan. She has a history of gastric bypass surgery and states she can only take liquid Tylenol. Medications  acetaminophen (TYLENOL) solution 650 mg (650 mg Oral Given 06/11/15 1526)   I personally performed the services described in this documentation, which was scribed in my presence. The recorded information has been reviewed and is accurate.    Ottie Glazier, PA-C 06/11/15 1921  Leo Grosser, MD 06/12/15 [phone]

## 2015-06-11 NOTE — Discharge Instructions (Signed)
Concussion Follow-up with her primary care physician. Apply ice to the head for swelling. A concussion, or closed-head injury, is a brain injury caused by a direct blow to the head or by a quick and sudden movement (jolt) of the head or neck. Concussions are usually not life-threatening. Even so, the effects of a concussion can be serious. If you have had a concussion before, you are more likely to experience concussion-like symptoms after a direct blow to the head.  CAUSES  Direct blow to the head, such as from running into another player during a soccer game, being hit in a fight, or hitting your head on a hard surface.  A jolt of the head or neck that causes the brain to move back and forth inside the skull, such as in a car crash. SIGNS AND SYMPTOMS The signs of a concussion can be hard to notice. Early on, they may be missed by you, family members, and health care providers. You may look fine but act or feel differently. Symptoms are usually temporary, but they may last for days, weeks, or even longer. Some symptoms may appear right away while others may not show up for hours or days. Every head injury is different. Symptoms include:  Mild to moderate headaches that will not go away.  A feeling of pressure inside your head.  Having more trouble than usual:  Learning or remembering things you have heard.  Answering questions.  Paying attention or concentrating.  Organizing daily tasks.  Making decisions and solving problems.  Slowness in thinking, acting or reacting, speaking, or reading.  Getting lost or being easily confused.  Feeling tired all the time or lacking energy (fatigued).  Feeling drowsy.  Sleep disturbances.  Sleeping more than usual.  Sleeping less than usual.  Trouble falling asleep.  Trouble sleeping (insomnia).  Loss of balance or feeling lightheaded or dizzy.  Nausea or vomiting.  Numbness or tingling.  Increased sensitivity  to:  Sounds.  Lights.  Distractions.  Vision problems or eyes that tire easily.  Diminished sense of taste or smell.  Ringing in the ears.  Mood changes such as feeling sad or anxious.  Becoming easily irritated or angry for little or no reason.  Lack of motivation.  Seeing or hearing things other people do not see or hear (hallucinations). DIAGNOSIS Your health care provider can usually diagnose a concussion based on a description of your injury and symptoms. He or she will ask whether you passed out (lost consciousness) and whether you are having trouble remembering events that happened right before and during your injury. Your evaluation might include:  A brain scan to look for signs of injury to the brain. Even if the test shows no injury, you may still have a concussion.  Blood tests to be sure other problems are not present. TREATMENT  Concussions are usually treated in an emergency department, in urgent care, or at a clinic. You may need to stay in the hospital overnight for further treatment.  Tell your health care provider if you are taking any medicines, including prescription medicines, over-the-counter medicines, and natural remedies. Some medicines, such as blood thinners (anticoagulants) and aspirin, may increase the chance of complications. Also tell your health care provider whether you have had alcohol or are taking illegal drugs. This information may affect treatment.  Your health care provider will send you home with important instructions to follow.  How fast you will recover from a concussion depends on many factors. These factors include  how severe your concussion is, what part of your brain was injured, your age, and how healthy you were before the concussion.  Most people with mild injuries recover fully. Recovery can take time. In general, recovery is slower in older persons. Also, persons who have had a concussion in the past or have other medical  problems may find that it takes longer to recover from their current injury. HOME CARE INSTRUCTIONS General Instructions  Carefully follow the directions your health care provider gave you.  Only take over-the-counter or prescription medicines for pain, discomfort, or fever as directed by your health care provider.  Take only those medicines that your health care provider has approved.  Do not drink alcohol until your health care provider says you are well enough to do so. Alcohol and certain other drugs may slow your recovery and can put you at risk of further injury.  If it is harder than usual to remember things, write them down.  If you are easily distracted, try to do one thing at a time. For example, do not try to watch TV while fixing dinner.  Talk with family members or close friends when making important decisions.  Keep all follow-up appointments. Repeated evaluation of your symptoms is recommended for your recovery.  Watch your symptoms and tell others to do the same. Complications sometimes occur after a concussion. Older adults with a brain injury may have a higher risk of serious complications, such as a blood clot on the brain.  Tell your teachers, school nurse, school counselor, coach, athletic trainer, or work Freight forwarder about your injury, symptoms, and restrictions. Tell them about what you can or cannot do. They should watch for:  Increased problems with attention or concentration.  Increased difficulty remembering or learning new information.  Increased time needed to complete tasks or assignments.  Increased irritability or decreased ability to cope with stress.  Increased symptoms.  Rest. Rest helps the brain to heal. Make sure you:  Get plenty of sleep at night. Avoid staying up late at night.  Keep the same bedtime hours on weekends and weekdays.  Rest during the day. Take daytime naps or rest breaks when you feel tired.  Limit activities that require a  lot of thought or concentration. These include:  Doing homework or job-related work.  Watching TV.  Working on the computer.  Avoid any situation where there is potential for another head injury (football, hockey, soccer, basketball, martial arts, downhill snow sports and horseback riding). Your condition will get worse every time you experience a concussion. You should avoid these activities until you are evaluated by the appropriate follow-up health care providers. Returning To Your Regular Activities You will need to return to your normal activities slowly, not all at once. You must give your body and brain enough time for recovery.  Do not return to sports or other athletic activities until your health care provider tells you it is safe to do so.  Ask your health care provider when you can drive, ride a bicycle, or operate heavy machinery. Your ability to react may be slower after a brain injury. Never do these activities if you are dizzy.  Ask your health care provider about when you can return to work or school. Preventing Another Concussion It is very important to avoid another brain injury, especially before you have recovered. In rare cases, another injury can lead to permanent brain damage, brain swelling, or death. The risk of this is greatest during the first  7-10 days after a head injury. Avoid injuries by:  Wearing a seat belt when riding in a car.  Drinking alcohol only in moderation.  Wearing a helmet when biking, skiing, skateboarding, skating, or doing similar activities.  Avoiding activities that could lead to a second concussion, such as contact or recreational sports, until your health care provider says it is okay.  Taking safety measures in your home.  Remove clutter and tripping hazards from floors and stairways.  Use grab bars in bathrooms and handrails by stairs.  Place non-slip mats on floors and in bathtubs.  Improve lighting in dim areas. SEEK MEDICAL  CARE IF:  You have increased problems paying attention or concentrating.  You have increased difficulty remembering or learning new information.  You need more time to complete tasks or assignments than before.  You have increased irritability or decreased ability to cope with stress.  You have more symptoms than before. Seek medical care if you have any of the following symptoms for more than 2 weeks after your injury:  Lasting (chronic) headaches.  Dizziness or balance problems.  Nausea.  Vision problems.  Increased sensitivity to noise or light.  Depression or mood swings.  Anxiety or irritability.  Memory problems.  Difficulty concentrating or paying attention.  Sleep problems.  Feeling tired all the time. SEEK IMMEDIATE MEDICAL CARE IF:  You have severe or worsening headaches. These may be a sign of a blood clot in the brain.  You have weakness (even if only in one hand, leg, or part of the face).  You have numbness.  You have decreased coordination.  You vomit repeatedly.  You have increased sleepiness.  One pupil is larger than the other.  You have convulsions.  You have slurred speech.  You have increased confusion. This may be a sign of a blood clot in the brain.  You have increased restlessness, agitation, or irritability.  You are unable to recognize people or places.  You have neck pain.  It is difficult to wake you up.  You have unusual behavior changes.  You lose consciousness. MAKE SURE YOU:  Understand these instructions.  Will watch your condition.  Will get help right away if you are not doing well or get worse. Document Released: 11/21/2003 Document Revised: 09/05/2013 Document Reviewed: 03/23/2013 [facility] Patient Information 2015 [place], [place]. This information is not intended to replace advice given to you by your health care provider. Make sure you discuss any questions you have with your health care provider.

## 2015-06-26 ENCOUNTER — Ambulatory Visit (AMBULATORY_SURGERY_CENTER): Payer: Self-pay | Admitting: *Deleted

## 2015-06-26 VITALS — Ht 64.5 in | Wt 167.0 lb

## 2015-06-26 DIAGNOSIS — Z1211 Encounter for screening for malignant neoplasm of colon: Secondary | ICD-10-CM

## 2015-06-26 MED ORDER — NA SULFATE-K SULFATE-MG SULF 17.5-3.13-1.6 GM/177ML PO SOLN: ORAL | Status: DC

## 2015-06-26 NOTE — Progress Notes (Signed)
Patient denies any allergies to eggs or soy. Patient denies any problems with anesthesia/sedation. Patient denies any oxygen use at home and does not take any diet/weight loss medications. EMMI education assisgned to patient on colonoscopy, this was explained and instructions given to patient. 

## 2015-07-10 ENCOUNTER — Ambulatory Visit (AMBULATORY_SURGERY_CENTER): Payer: BLUE CROSS/BLUE SHIELD | Admitting: Gastroenterology

## 2015-07-10 ENCOUNTER — Encounter: Payer: Self-pay | Admitting: Gastroenterology

## 2015-07-10 VITALS — BP 125/69 | HR 72 | Temp 96.8°F | Resp 20 | Ht 64.0 in | Wt 166.0 lb

## 2015-07-10 DIAGNOSIS — D125 Benign neoplasm of sigmoid colon: Secondary | ICD-10-CM | POA: Diagnosis not present

## 2015-07-10 DIAGNOSIS — D128 Benign neoplasm of rectum: Secondary | ICD-10-CM

## 2015-07-10 DIAGNOSIS — Z1211 Encounter for screening for malignant neoplasm of colon: Secondary | ICD-10-CM | POA: Diagnosis present

## 2015-07-10 DIAGNOSIS — K635 Polyp of colon: Secondary | ICD-10-CM | POA: Diagnosis not present

## 2015-07-10 DIAGNOSIS — K621 Rectal polyp: Secondary | ICD-10-CM | POA: Diagnosis not present

## 2015-07-10 DIAGNOSIS — D123 Benign neoplasm of transverse colon: Secondary | ICD-10-CM

## 2015-07-10 MED ORDER — SODIUM CHLORIDE 0.9 % IV SOLN: 500.0000 mL | INTRAVENOUS | Status: DC

## 2015-07-10 NOTE — Progress Notes (Signed)
Called to room to assist during endoscopic procedure.  Patient ID and intended procedure confirmed with present staff. Received instructions for my participation in the procedure from the performing physician.  

## 2015-07-10 NOTE — Progress Notes (Signed)
Transferred to recovery room. A/O x3, pleased with MAC.  VSS.  Report to Tracy, RN. 

## 2015-07-10 NOTE — Patient Instructions (Signed)
Findings:  Polyps Recommendations:  Resume medications and Diet  YOU HAD AN ENDOSCOPIC PROCEDURE TODAY AT [facility]:   Refer to the procedure report that was given to you for any specific questions about what was found during the examination.  If the procedure report does not answer your questions, please call your gastroenterologist to clarify.  If you requested that your care partner not be given the details of your procedure findings, then the procedure report has been included in a sealed envelope for you to review at your convenience later.  YOU SHOULD EXPECT: Some feelings of bloating in the abdomen. Passage of more gas than usual.  Walking can help get rid of the air that was put into your GI tract during the procedure and reduce the bloating. If you had a lower endoscopy (such as a colonoscopy or flexible sigmoidoscopy) you may notice spotting of blood in your stool or on the toilet paper. If you underwent a bowel prep for your procedure, you may not have a normal bowel movement for a few days.  Please Note:  You might notice some irritation and congestion in your nose or some drainage.  This is from the oxygen used during your procedure.  There is no need for concern and it should clear up in a day or so.  SYMPTOMS TO REPORT IMMEDIATELY:   Following lower endoscopy (colonoscopy or flexible sigmoidoscopy):  Excessive amounts of blood in the stool  Significant tenderness or worsening of abdominal pains  Swelling of the abdomen that is new, acute  Fever of 100F or higher   Following upper endoscopy (EGD)  Vomiting of blood or coffee ground material  New chest pain or pain under the shoulder blades  Painful or persistently difficult swallowing  New shortness of breath  Fever of 100F or higher  Black, tarry-looking stools  For urgent or emergent issues, a gastroenterologist can be reached at any hour by calling [phone].   DIET: Your first meal following  the procedure should be a small meal and then it is ok to progress to your normal diet. Heavy or fried foods are harder to digest and may make you feel nauseous or bloated.  Likewise, meals heavy in dairy and vegetables can increase bloating.  Drink plenty of fluids but you should avoid alcoholic beverages for 24 hours.  ACTIVITY:  You should plan to take it easy for the rest of today and you should NOT DRIVE or use heavy machinery until tomorrow (because of the sedation medicines used during the test).    FOLLOW UP: Our staff will call the number listed on your records the next business day following your procedure to check on you and address any questions or concerns that you may have regarding the information given to you following your procedure. If we do not reach you, we will leave a message.  However, if you are feeling well and you are not experiencing any problems, there is no need to return our call.  We will assume that you have returned to your regular daily activities without incident.  If any biopsies were taken you will be contacted by phone or by letter within the next 1-3 weeks.  Please call us at [phone] if you have not heard about the biopsies in 3 weeks.    SIGNATURES/CONFIDENTIALITY: You and/or your care partner have signed paperwork which will be entered into your electronic medical record.  These signatures attest to the fact that that the  information above on your After Visit Summary has been reviewed and is understood.  Full responsibility of the confidentiality of this discharge information lies with you and/or your care-partner.YOU HAD AN ENDOSCOPIC PROCEDURE TODAY AT [facility]:   Refer to the procedure report that was given to you for any specific questions about what was found during the examination.  If the procedure report does not answer your questions, please call your gastroenterologist to clarify.  If you requested that your care partner not be  given the details of your procedure findings, then the procedure report has been included in a sealed envelope for you to review at your convenience later.  YOU SHOULD EXPECT: Some feelings of bloating in the abdomen. Passage of more gas than usual.  Walking can help get rid of the air that was put into your GI tract during the procedure and reduce the bloating. If you had a lower endoscopy (such as a colonoscopy or flexible sigmoidoscopy) you may notice spotting of blood in your stool or on the toilet paper. If you underwent a bowel prep for your procedure, you may not have a normal bowel movement for a few days.  Please Note:  You might notice some irritation and congestion in your nose or some drainage.  This is from the oxygen used during your procedure.  There is no need for concern and it should clear up in a day or so.  SYMPTOMS TO REPORT IMMEDIATELY:   Following lower endoscopy (colonoscopy or flexible sigmoidoscopy):  Excessive amounts of blood in the stool  Significant tenderness or worsening of abdominal pains  Swelling of the abdomen that is new, acute  Fever of 100F or higher   Following upper endoscopy (EGD)  Vomiting of blood or coffee ground material  New chest pain or pain under the shoulder blades  Painful or persistently difficult swallowing  New shortness of breath  Fever of 100F or higher  Black, tarry-looking stools  For urgent or emergent issues, a gastroenterologist can be reached at any hour by calling [phone].   DIET: Your first meal following the procedure should be a small meal and then it is ok to progress to your normal diet. Heavy or fried foods are harder to digest and may make you feel nauseous or bloated.  Likewise, meals heavy in dairy and vegetables can increase bloating.  Drink plenty of fluids but you should avoid alcoholic beverages for 24 hours.  ACTIVITY:  You should plan to take it easy for the rest of today and you should NOT DRIVE or  use heavy machinery until tomorrow (because of the sedation medicines used during the test).    FOLLOW UP: Our staff will call the number listed on your records the next business day following your procedure to check on you and address any questions or concerns that you may have regarding the information given to you following your procedure. If we do not reach you, we will leave a message.  However, if you are feeling well and you are not experiencing any problems, there is no need to return our call.  We will assume that you have returned to your regular daily activities without incident.  If any biopsies were taken you will be contacted by phone or by letter within the next 1-3 weeks.  Please call us at [phone] if you have not heard about the biopsies in 3 weeks.    SIGNATURES/CONFIDENTIALITY: You and/or your care partner have signed paperwork  which will be entered into your electronic medical record.  These signatures attest to the fact that that the information above on your After Visit Summary has been reviewed and is understood.  Full responsibility of the confidentiality of this discharge information lies with you and/or your care-partner.  Please follow all discharge instructions given to you by the recovery room nurse. If you have any questions or problems after discharge please call one of the numbers listed above. You will receive a phone call in the am to see how you are doing and answer any questions you may have. Thank you for choosing [facility] for your health care needs.

## 2015-07-10 NOTE — Op Note (Signed)
[facility]  [address] [place], [id number]   COLONOSCOPY PROCEDURE REPORT  PATIENT: Katherine, Todd  MR#: [id number] BIRTHDATE: Feb 22, 1966 , 49  yrs. old GENDER: female ENDOSCOPIST: Yetta Flock, MD REFERRED BY: [name] MD PROCEDURE DATE:  07/10/2015 PROCEDURE:   Colonoscopy, screening and Colonoscopy with biopsy First Screening Colonoscopy - Avg.  risk and is 50 yrs.  old or older Yes.  Prior Negative Screening - Now for repeat screening. N/A  History of Adenoma - Now for follow-up colonoscopy & has been > or = to 3 yrs.  N/A  Polyps removed today? Yes ASA CLASS:   Class II INDICATIONS:Screening for colonic neoplasia and Colorectal Neoplasm Risk Assessment for this procedure is average risk. MEDICATIONS: Propofol 350 mg IV  DESCRIPTION OF PROCEDURE:   After the risks benefits and alternatives of the procedure were thoroughly explained, informed consent was obtained.  The digital rectal exam revealed no abnormalities of the rectum.   The LB [id number]-[id number]  endoscope was introduced through the anus and advanced to the cecum, which was identified by both the appendix and ileocecal valve. No adverse events experienced.   The quality of the prep was adequate  The instrument was then slowly withdrawn as the colon was fully examined. Estimated blood loss is zero unless otherwise noted in this procedure report.  COLON FINDINGS: Two sessile polyps ranging from 3 to 71mm in size were found in the transverse colon.  Polypectomies were performed with cold forceps.  The resection was complete, the polyp tissue was completely retrieved and sent to histology.   A sessile polyp measuring 3 mm in size was found in the sigmoid colon.  A polypectomy was performed with cold forceps.  The resection was complete, the polyp tissue was completely retrieved and sent to histology.   A sessile polyp measuring 3 mm in size was found in the rectum.  A polypectomy  was performed with cold forceps.  The resection was complete, the polyp tissue was completely retrieved and sent to histology.   The examination was otherwise normal. Retroflexed views revealed internal hemorrhoids. The time to cecum = 5.5 Withdrawal time = 24.1   The scope was withdrawn and the procedure completed. COMPLICATIONS: There were no immediate complications.  ENDOSCOPIC IMPRESSION: 1.   Two sessile polyps ranging from 3 to 42mm in size were found in the transverse colon; polypectomies were performed with cold forceps 2.   Sessile polyp was found in the sigmoid colon; polypectomy was performed with cold forceps 3.   Sessile polyp was found in the rectum; polypectomy was performed with cold forceps 4.   The examination was otherwise normal  RECOMMENDATIONS: 1.  Await pathology results 2.  Resume diet Resume medications  eSigned:  Yetta Flock, MD 07/10/2015 8:19 AM   cc: [name], MD, the patient   PATIENT NAME:  Katherine, Todd MR#: [id number]

## 2015-07-11 ENCOUNTER — Telehealth: Payer: Self-pay | Admitting: *Deleted

## 2015-07-11 NOTE — Telephone Encounter (Signed)
  Follow up Call-  Call back number 07/10/2015  Post procedure Call Back phone  # father and mother  Permission to leave phone message Yes  comments [phone]     Patient questions:  Do you have a fever, pain , or abdominal swelling? No. Pain Score  0 *  Have you tolerated food without any problems? Yes.    Have you been able to return to your normal activities? Yes.    Do you have any questions about your discharge instructions: Diet   No. Medications  No. Follow up visit  No.  Do you have questions or concerns about your Care? No.  Actions: * If pain score is 4 or above: No action needed, pain <4.

## 2015-07-15 ENCOUNTER — Encounter: Payer: Self-pay | Admitting: Gastroenterology

## 2015-09-03 ENCOUNTER — Telehealth: Payer: Self-pay | Admitting: Nurse Practitioner

## 2015-09-03 NOTE — Telephone Encounter (Signed)
Patient says her Lo Loestrin is expensive and is wanting to know if we have a savings cards that she can get. Best # to reach: [phone] (patient is at work you can reach her tomorrow 09/04/2015)

## 2015-09-04 NOTE — Telephone Encounter (Signed)
Spoke with patient. Advised patient she can visit [email or web address] where a savings card can be obtained. Will need to print off the savings card and provide it to the pharmacy. Patient is agreeable. Will return call if she has any additional questions or if rx will still be too costly with savings card.  Routing to provider for final review. Patient agreeable to disposition. Will close encounter.

## 2016-02-07 ENCOUNTER — Other Ambulatory Visit: Payer: Self-pay | Admitting: Nurse Practitioner

## 2016-02-07 NOTE — Telephone Encounter (Signed)
Medication refill request: lo loestrin Last AEX: 04-02-15 Next AEX: 04-14-16 Last MMG (if hormonal medication request): 04-24-15 category c density,birads 1:neg Refill authorized: pt needs refill to last until aex. Please approve

## 2016-03-30 ENCOUNTER — Other Ambulatory Visit: Payer: Self-pay | Admitting: Nurse Practitioner

## 2016-03-30 DIAGNOSIS — Z139 Encounter for screening, unspecified: Secondary | ICD-10-CM

## 2016-04-13 ENCOUNTER — Encounter: Payer: Self-pay | Admitting: Nurse Practitioner

## 2016-04-13 NOTE — Progress Notes (Signed)
Patient ID: Katherine Todd, female   DOB: 1965/11/06, 50 y.o.   MRN: [id number]  50 y.o. G0P0000 Single Caucasian Fe here for annual exam. Not SA since 07/2015, he ended that relationship. No vaso symptoms.  Amenorrhea on OCP and wants to continue.  Patient's last menstrual period was 10/27/2010 (exact date).          Sexually active: No.  The current method of family planning is OCP (estrogen/progesterone) and abstinence.    Exercising: No.  The patient does not participate in regular exercise at present. Smoker:  no  Health Maintenance: Pap: 04/02/15, Negative with neg HR HPV MMG:  04/24/15, 3D, Bi-Rads 1:  Negative Colonoscopy: 07/10/15, Tubular Adenoma, repeat in 5 years BMD: with endocrinology, normal, will have repeat in 05/2016 TDaP:  01/12/10 HIV: done today Labs: PCP  Urine: PCP   reports that she has never smoked. She has never used smokeless tobacco. She reports that she does not drink alcohol or use drugs.  Past Medical History:  Diagnosis Date  . Depression   . Fibrocystic breast   . Fibromyalgia   . Migraines   . Obesity   . Sleep apnea    not currently    Past Surgical History:  Procedure Laterality Date  . GASTRIC BYPASS  04/2007, revision 02/2010   "Mini" gastric bypass  . HERNIA REPAIR  2009   incisional repair from gastsric bypass  . LAPAROSCOPIC CHOLECYSTECTOMY  09/2002   Dr. March Rummage  . TONSILLECTOMY AND ADENOIDECTOMY  age  16  . WISDOM TOOTH EXTRACTION  age 59    Current Outpatient Prescriptions  Medication Sig Dispense Refill  . ARIPiprazole (ABILIFY) 5 MG tablet Take 2.5 mg by mouth daily.    Marland Kitchen BRINTELLIX 20 MG TABS Take 20 mg by mouth daily.    Marland Kitchen buPROPion (WELLBUTRIN XL) 300 MG 24 hr tablet Take 1 tablet by mouth daily.    . cetirizine (ZYRTEC) 10 MG tablet Take 10 mg by mouth daily.    . clonazePAM (KLONOPIN) 0.5 MG tablet Take 0.5 mg by mouth 2 (two) times daily as needed for anxiety.    . cyanocobalamin 500 MCG tablet Take 500 mcg by mouth daily.     . ferrous sulfate 325 (65 FE) MG tablet Take 1 tablet by mouth daily.    . finasteride (PROSCAR) 5 MG tablet Take 0.5 tablets by mouth daily.   2  . ipratropium (ATROVENT) 0.03 % nasal spray USE 2 SPRAYS IN EACH NOSTRIL TID AS NEEDED  2  . LO LOESTRIN FE 1 MG-10 MCG / 10 MCG tablet TAKE 1 TABLET BY MOUTH EVERY DAY 84 tablet 0  . lovastatin (MEVACOR) 20 MG tablet Take 20 mg by mouth once a week.    . Multiple Vitamin (MULTIVITAMIN) tablet Take 1 tablet by mouth 2 (two) times daily.     . Multiple Vitamins-Minerals (OCUVITE EXTRA PO) Take 1 tablet by mouth daily.    Marland Kitchen spironolactone (ALDACTONE) 100 MG tablet Take 1 tablet by mouth daily.  2  . SUMAtriptan (IMITREX) 100 MG tablet TAKE 1 TABLET BY MOUTH ONCE AS NEEDED FOR MIGRAINE(MAY REPEAT IN 2 HORUS. MAX OF 2 TABLETS PER 24 HOURS)    . Vitamin D, Ergocalciferol, (DRISDOL) 50000 UNITS CAPS Take 50,000 Units by mouth daily.     Marland Kitchen acetaminophen (TYLENOL) 160 MG/5ML elixir Take 20 mLs (640 mg total) by mouth every 8 (eight) hours as needed for fever. (Patient not taking: Reported on 07/10/2015) 120 mL 0  No current facility-administered medications for this visit.     Family History  Problem Relation Age of Onset  . Colon cancer Maternal Grandfather 60    colon caner  . Hyperlipidemia Mother   . Prostate cancer Father 32  . Thyroid disease Father   . Melanoma Brother 35    back of knee     ROS:  Pertinent items are noted in HPI.  Otherwise, a comprehensive ROS was negative.  Exam:   BP 122/84 (BP Location: Right Arm, Patient Position: Sitting, Cuff Size: Normal)   Pulse 80   Ht 5' 4.75" (1.645 m)   Wt 172 lb (78 kg)   LMP 10/27/2010 (Exact Date)   BMI 28.84 kg/m  Height: 5' 4.75" (164.5 cm) Ht Readings from Last 3 Encounters:  04/14/16 5' 4.75" (1.645 m)  07/10/15 5\' 4"  (1.626 m)  06/26/15 5' 4.5" (1.638 m)    General appearance: alert, cooperative and appears stated age Head: Normocephalic, without obvious abnormality,  atraumatic Neck: no adenopathy, supple, symmetrical, trachea midline and thyroid normal to inspection and palpation Lungs: clear to auscultation bilaterally Breasts: normal appearance, no masses or tenderness Heart: regular rate and rhythm Abdomen: soft, non-tender; no masses,  no organomegaly Extremities: extremities normal, atraumatic, no cyanosis or edema Skin: Skin color, texture, turgor normal. No rashes or lesions Lymph nodes: Cervical, supraclavicular, and axillary nodes normal. No abnormal inguinal nodes palpated Neurologic: Grossly normal   Pelvic: External genitalia:  no lesions              Urethra:  normal appearing urethra with no masses, tenderness or lesions              Bartholin's and Skene's: normal                 Vagina: normal appearing vagina with normal color and discharge, no lesions              Cervix: anteverted              Pap taken: No. Bimanual Exam:  Uterus:  normal size, contour, position, consistency, mobility, non-tender              Adnexa: no mass, fullness, tenderness               Rectovaginal: Confirms               Anus:  normal sphincter tone, no lesions  Chaperone present: yes  A:  Well Woman with normal exam  OCP for cycle regulation and birth control             R/O STD's            S/P gastric bypass 8/08 with revision 02/2010 History of depression, hair loss    P:   Reviewed health and wellness pertinent to exam  Pap smear as above  Mammogram is due later this month and will schedule  Refill on OCP for a year  Follow with labs  Counseled on breast self exam, mammography screening, use and side effects of OCP's, adequate intake of calcium and vitamin D, diet and exercise, Kegel's exercises return annually or prn  An After Visit Summary was printed and given to the patient.

## 2016-04-14 ENCOUNTER — Ambulatory Visit (INDEPENDENT_AMBULATORY_CARE_PROVIDER_SITE_OTHER): Payer: BLUE CROSS/BLUE SHIELD | Admitting: Nurse Practitioner

## 2016-04-14 ENCOUNTER — Encounter: Payer: Self-pay | Admitting: Nurse Practitioner

## 2016-04-14 VITALS — BP 122/84 | HR 80 | Ht 64.75 in | Wt 172.0 lb

## 2016-04-14 DIAGNOSIS — Z0000 Encounter for general adult medical examination without abnormal findings: Secondary | ICD-10-CM | POA: Diagnosis not present

## 2016-04-14 DIAGNOSIS — Z9884 Bariatric surgery status: Secondary | ICD-10-CM

## 2016-04-14 DIAGNOSIS — Z9889 Other specified postprocedural states: Secondary | ICD-10-CM | POA: Diagnosis not present

## 2016-04-14 DIAGNOSIS — L659 Nonscarring hair loss, unspecified: Secondary | ICD-10-CM | POA: Diagnosis not present

## 2016-04-14 DIAGNOSIS — Z113 Encounter for screening for infections with a predominantly sexual mode of transmission: Secondary | ICD-10-CM

## 2016-04-14 DIAGNOSIS — Z01419 Encounter for gynecological examination (general) (routine) without abnormal findings: Secondary | ICD-10-CM

## 2016-04-14 DIAGNOSIS — E559 Vitamin D deficiency, unspecified: Secondary | ICD-10-CM | POA: Diagnosis not present

## 2016-04-14 MED ORDER — NORETHIN-ETH ESTRAD-FE BIPHAS 1 MG-10 MCG / 10 MCG PO TABS: 1.0000 | ORAL_TABLET | Freq: Every day | ORAL | 4 refills | Status: DC

## 2016-04-14 NOTE — Patient Instructions (Signed)

## 2016-04-14 NOTE — Progress Notes (Signed)
Encounter reviewed Jill Jertson, MD   

## 2016-04-15 LAB — STD PANEL
HIV 1&2 Ab, 4th Generation: NONREACTIVE
Hepatitis B Surface Ag: NEGATIVE

## 2016-04-16 LAB — IPS N GONORRHOEA AND CHLAMYDIA BY PCR

## 2016-04-27 ENCOUNTER — Ambulatory Visit
Admission: RE | Admit: 2016-04-27 | Discharge: 2016-04-27 | Disposition: A | Discharge status: Home / Self Care | Payer: BLUE CROSS/BLUE SHIELD | Source: Ambulatory Visit | Attending: Nurse Practitioner | Admitting: Nurse Practitioner

## 2016-04-27 DIAGNOSIS — Z139 Encounter for screening, unspecified: Secondary | ICD-10-CM

## 2016-11-26 ENCOUNTER — Encounter: Payer: Self-pay | Admitting: Neurology

## 2016-11-26 ENCOUNTER — Ambulatory Visit (INDEPENDENT_AMBULATORY_CARE_PROVIDER_SITE_OTHER): Payer: BLUE CROSS/BLUE SHIELD | Admitting: Neurology

## 2016-11-26 VITALS — BP 126/88 | HR 80 | Resp 18 | Ht 64.5 in | Wt 177.5 lb

## 2016-11-26 DIAGNOSIS — Z79899 Other long term (current) drug therapy: Secondary | ICD-10-CM | POA: Diagnosis not present

## 2016-11-26 DIAGNOSIS — R0683 Snoring: Secondary | ICD-10-CM | POA: Diagnosis not present

## 2016-11-26 DIAGNOSIS — R519 Headache, unspecified: Secondary | ICD-10-CM | POA: Insufficient documentation

## 2016-11-26 DIAGNOSIS — R51 Headache: Secondary | ICD-10-CM | POA: Diagnosis not present

## 2016-11-26 NOTE — Progress Notes (Signed)
SLEEP MEDICINE CLINIC   Provider:  [name], M D  Referring Provider: [name], MD Primary Care Physician:  [name], MD  Chief Complaint  Patient presents with  . OSA evaluation    rm 10, New Pt, "Dr [name] believes I need to be back on CPAP because I am having migraines"    HPI:  Katherine Todd is a 51 y.o. female , seen here as a referral from Dr. [name] for a sleep consultation.  [name] had been seen for sleep problems before, but not in our clinic she had seen [name] "[facility] " Joretta Bachelor, worked at the headache center. The patient had been morbidly obese before she underwent gastric bypass surgery and had one sleep study probably 15 years ago which documented OSA and the need for CPAP. With the weight loss surgery her body mass index was reduced significantly. She lost a total of 140 pounds following surgery at [facility] in 2008.  In 2012 a second sleep study was ordered by Dr. Joretta Bachelor and interpreted by her, resulting in a recommendation that CPAP was no longer necessary. The patient also suffers from migraines since teenage . The headaches were debilitating before the right medication was found. For a long time she has felt that Imitrex has controlled her headaches well, but last week she felt an exacerbation with 3 severe migraines. She saw Dr. [name] and didn't response he would like to look at the possibility of sleep apnea still being present or having returned. The headache quality is described as a dull ache that can exacerbate to a level of sharp stabbing sensations. The dull ache is tolerable with sharp and stabbing sensations constitute a pain level of 8, 9 or 10 out of 10.  From time to time she has been woken by headaches, but not recently. She does not report frequent morning headaches. Headaches arise during the day at various times. Nausea is strongly associated.   Sleep habits are as follows: The patient watches TV in her  bedroom for the last hour before intended sleep time. After that our bedroom remains cool, quiet and dark. Patient lives alone. She does not have pets with her. She feels that the TV eases her way into sleep but she does not complain of insomnia. Usually she has no trouble initiating sleep and sleeping through the night. Because of her history of gastric bypass she does have frequent bathroom breaks, not related to nocturia. She goes twice at night. She has to get up at 5:45 AM, and by the time usually has gained 7 or 8 hours of nocturnal sleep time. She does not feel refreshed and restored in the morning however. She would like to sleep another hour. She recalls the times when she had sleep interruptions due to headaches but describes the headaches not as sharp or retro-orbital sensations and rather as dull and global headaches.  Sleep medical history and family sleep history:  Maternal uncle and grandmother were morbidly obese.  The patient was short of breath before undergoing GBS. She has had 2 revisions to the gastric bypass. She has been compliant with vitamin and trace elements supplementation.  Social history:  Single, the patient has never been a tobacco user is a nonsmoker lifelong, she does not drink alcohol, she drinks 2-3 cups of coffee a day and one soda a day. 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  Review of Systems: Out of a complete 14 system review, the patient complains of only the following symptoms, and all other reviewed systems are negative. She thinks she may snore, she has not been recently told that she has snore or have apnea. Nonrestorative sleep. She does not report vivid dreams.  Epworth score 6 , Fatigue severity score 23 , depression score n/a    Social History   Social History  . Marital status: Single    Spouse name: N/A  . Number of children: 0  . Years of education: 20    Occupational History  .      works for [employer] farm manager   Social History Main Topics  . Smoking status: Never Smoker  . Smokeless tobacco: Never Used  . Alcohol use No  . Drug use: No  . Sexual activity: Yes    Birth control/ protection: Post-menopausal, None   Other Topics Concern  . Not on file   Social History Narrative   Lives alone   Caffeine- coffee, 2-3 cups daily, soda 1 daily    Family History  Problem Relation Age of Onset  . Hyperlipidemia Mother   . Prostate cancer Father 68  . Thyroid disease Father   . Melanoma Brother 35    back of knee   . Colon cancer Maternal Grandfather 60    colon caner  . Stroke Maternal Grandmother   . Cancer Paternal Grandmother     liver    Past Medical History:  Diagnosis Date  . Depression   . Fibrocystic breast   . Fibromyalgia    11/26/16 'resolved'  . Migraines   . Obesity   . Sleep apnea    not currently    Past Surgical History:  Procedure Laterality Date  . GASTRIC BYPASS  04/2007, revision 02/2010   "Mini" gastric bypass  . HERNIA REPAIR  2009   incisional repair from gastsric bypass  . LAPAROSCOPIC CHOLECYSTECTOMY  09/2002   Dr. March Rummage  . TONSILLECTOMY AND ADENOIDECTOMY  age  24  . WISDOM TOOTH EXTRACTION  age 18    Current Outpatient Prescriptions  Medication Sig Dispense Refill  . acetaminophen (TYLENOL) 160 MG/5ML elixir Take 20 mLs (640 mg total) by mouth every 8 (eight) hours as needed for fever. 120 mL 0  . ARIPiprazole (ABILIFY) 5 MG tablet Take 2.5 mg by mouth daily.    Marland Kitchen BRINTELLIX 20 MG TABS Take 20 mg by mouth daily.    Marland Kitchen buPROPion (WELLBUTRIN XL) 300 MG 24 hr tablet Take 1 tablet by mouth daily.    . cetirizine (ZYRTEC) 10 MG tablet Take 10 mg by mouth daily.    . clonazePAM (KLONOPIN) 0.5 MG tablet Take 0.5 mg by mouth 2 (two) times daily as needed for anxiety.    . cyanocobalamin 500 MCG tablet Take 500 mcg by mouth daily.    . ferrous sulfate 325 (65 FE) MG tablet Take 1 tablet by mouth daily.     . finasteride (PROSCAR) 5 MG tablet Take 0.5 tablets by mouth daily.   2  . ipratropium (ATROVENT) 0.03 % nasal spray USE 2 SPRAYS IN EACH NOSTRIL TID AS NEEDED  2  . lovastatin (MEVACOR) 20 MG tablet Take 20 mg by mouth once a week.    . minoxidil (ROGAINE) 2 % external solution Apply topically 2 (two) times daily.    . Multiple Vitamin (MULTIVITAMIN) tablet Take 1 tablet by mouth 2 (two) times daily.     . Multiple Vitamins-Minerals (OCUVITE  EXTRA PO) Take 1 tablet by mouth daily.    . Norethindrone-Ethinyl Estradiol-Fe Biphas (LO LOESTRIN FE) 1 MG-10 MCG / 10 MCG tablet Take 1 tablet by mouth daily. 84 tablet 4  . spironolactone (ALDACTONE) 100 MG tablet Take 1 tablet by mouth daily.  2  . SUMAtriptan (IMITREX) 100 MG tablet TAKE 1 TABLET BY MOUTH ONCE AS NEEDED FOR MIGRAINE(MAY REPEAT IN 2 HORUS. MAX OF 2 TABLETS PER 24 HOURS)    . Vitamin D, Ergocalciferol, (DRISDOL) 50000 UNITS CAPS Take 50,000 Units by mouth daily.      No current facility-administered medications for this visit.     Allergies as of 11/26/2016 - Review Complete 11/26/2016  Allergen Reaction Noted  . [employer] diphedryl [diphenhydramine] Other (See Comments) 04/02/2015  . Atorvastatin Other (See Comments) 04/02/2015  . Adhesive [tape] Rash 02/21/2013    Vitals: BP 126/88   Pulse 80   Resp 18   Ht 5' 4.5" (1.638 m)   Wt 177 lb 8 oz (80.5 kg)   BMI 30.00 kg/m  Last Weight:  Wt Readings from Last 1 Encounters:  11/26/16 177 lb 8 oz (80.5 kg)   JQB:HALP mass index is 30 kg/m.     Last Height:   Ht Readings from Last 1 Encounters:  11/26/16 5' 4.5" (1.638 m)    Physical exam:  General: The patient is awake, alert and appears not in acute distress. The patient is well groomed. Head: Normocephalic, atraumatic. Neck is supple. Mallampati ,  neck circumference:14.25 . Nasal airflow patent , TMJ click is not evident . Retrognathia is seen.  Cardiovascular:  Regular rate and rhythm , without  murmurs or  carotid bruit, and without distended neck veins. Respiratory: Lungs are clear to auscultation. Skin:  Without evidence of edema, or rash Trunk: BMI is 30 . The patient's posture is erect   Neurologic exam : The patient is awake and alert, oriented to place and time.   Attention span & concentration ability appears normal.  Speech is fluent without dysarthria, dysphonia or aphasia.  Mood and affect are appropriate.  Cranial nerves: Pupils are equal and briskly reactive to light. Funduscopic exam without evidence of pallor or edema. Extraocular movements  in vertical and horizontal planes intact and without nystagmus. Visual fields by finger perimetry are intact.Hearing to finger rub intact. Facial sensation intact to fine touch. Facial motor strength is symmetric and tongue and uvula move midline. Shoulder shrug was symmetrical.  Motor exam:   Normal tone, muscle bulk and symmetric strength in all extremities. Sensory:  Fine touch, pinprick and vibration were tested in all extremities. Proprioception tested in the upper extremities was normal. Coordination: Rapid alternating movements in the fingers/hands was normal. Finger-to-nose maneuver  normal without evidence of ataxia, dysmetria or tremor. Gait and station: Patient walks without assistive device and is able unassisted to climb up to the exam table. Strength within normal limits.  Stance is stable and normal.  Deep tendon reflexes: in the  upper and lower extremities are symmetric and intact. Babinski maneuver response is downgoing.   Assessment:  After physical and neurologic examination, review of laboratory studies,  Personal review of imaging studies, reports of other /same  Imaging studies ,  Results of polysomnography/ neurophysiology testing and pre-existing records as far as provided in visit., my assessment is   1) Mrs. Celmer does present with mild hypertension, she is overweight but not morbidly obese, and she has no recent  account of witnessed snoring or possible presence of  apnea. Her sleep is fragmented in relation to bathroom breaks but she usually initiate sleep promptly again. She does not have claudication, palpitation orthopnea dyspnea and there is no wheezing. She is a completely nonfocal neurologic exam. I would like for her to undergo a sleep test in relation to her history of bariatric weight loss surgery. I will order 2 nights of a home sleep test. My request to the patient is to use a sleep test one night at home with the instruction of our technologist, and if she feels that the night was not representative of her usual sleep she should have the option of repeating it. My hope was to rule out hypoxemia as a contributing factor to her headache disorder. Should the patient be snoring loudly but has little or mild  apnea , a dental device should be considered.  The patient was advised of the nature of the diagnosed sleep disorder , the treatment options and risks for general a health and wellness arising from not treating the condition.  I spent more than 35  minutes of face to face time with the patient. Greater than 50% of time was spent in counseling and coordination of care. We have discussed the diagnosis and differential and I answered the patient's questions.     Plan:  Treatment plan and additional workup :  HST      Asencion Partridge Bryssa Tones MD  11/26/2016   CC: [name], [address] [place], [place] [address]

## 2017-01-27 ENCOUNTER — Telehealth: Payer: Self-pay | Admitting: Neurology

## 2017-01-27 DIAGNOSIS — Z9989 Dependence on other enabling machines and devices: Secondary | ICD-10-CM

## 2017-01-27 DIAGNOSIS — G4733 Obstructive sleep apnea (adult) (pediatric): Secondary | ICD-10-CM

## 2017-01-27 DIAGNOSIS — G43019 Migraine without aura, intractable, without status migrainosus: Secondary | ICD-10-CM

## 2017-01-27 NOTE — Telephone Encounter (Signed)
I called pt and explained that Dr. Brett Fairy ordered the HST for her, and our sleep lab will file with her insurance and then call her to schedule it. Pt says that this has been quite the delay in care. I apologized on Dr. Edwena Felty behalf. I advised her that I will send this message to the sleep lab in hopes that they can expedite this process. Pt verbalized understanding.

## 2017-01-27 NOTE — Telephone Encounter (Signed)
It appears that Dr. Brett Fairy was considering a HST x 2 nights for this pt, but I cannot find where this was ordered. I will send to Dr. Brett Fairy to make sure this is still the plan, and if so, I will call the pt to discuss.

## 2017-01-27 NOTE — Telephone Encounter (Signed)
Pt calling because she wants to know the next plan of action, she was not given a date to follow up. Pt asking for a call back

## 2017-01-27 NOTE — Addendum Note (Signed)
Addended by: [name] on: 01/27/2017 10:44 AM   Modules accepted: Orders

## 2017-01-27 NOTE — Telephone Encounter (Signed)
I have started the process of getting an authorization

## 2017-02-17 ENCOUNTER — Ambulatory Visit (INDEPENDENT_AMBULATORY_CARE_PROVIDER_SITE_OTHER): Payer: BLUE CROSS/BLUE SHIELD | Admitting: Neurology

## 2017-02-17 ENCOUNTER — Encounter (INDEPENDENT_AMBULATORY_CARE_PROVIDER_SITE_OTHER): Payer: Self-pay

## 2017-02-17 DIAGNOSIS — Z9989 Dependence on other enabling machines and devices: Secondary | ICD-10-CM

## 2017-02-17 DIAGNOSIS — G43019 Migraine without aura, intractable, without status migrainosus: Secondary | ICD-10-CM

## 2017-02-17 DIAGNOSIS — G4733 Obstructive sleep apnea (adult) (pediatric): Secondary | ICD-10-CM | POA: Diagnosis not present

## 2017-03-03 ENCOUNTER — Telehealth: Payer: Self-pay | Admitting: Neurology

## 2017-03-03 NOTE — Telephone Encounter (Signed)
I called pt and advised her that Dr. Brett Fairy has been out of the office for the past two weeks, but when she returns next week, I expect that pt's HST results will be read. Pt verbalized understanding.

## 2017-03-03 NOTE — Telephone Encounter (Signed)
Pt request sleep results °

## 2017-03-08 ENCOUNTER — Telehealth: Payer: Self-pay

## 2017-03-08 NOTE — Telephone Encounter (Signed)
-----   Message from [name], MD sent at 03/08/2017  5:05 PM EDT ----- No significant sleep apnea identified, based on these  test results not qualified for CPAP prescription. Please send a  Cc : Dr [name]  CD

## 2017-03-08 NOTE — Telephone Encounter (Signed)
I called pt to discuss her sleep study results. No answer, left a message asking her to call me back. 

## 2017-03-08 NOTE — Procedures (Signed)
[facility]  [facility] [address] [place], [place] [address] NAME: Katherine Todd DOB: 02-10-1966   MEDICAL RECORD [id number]    DOS: 02/17/17 REFERRING PHYSICIAN: [name], MD STUDY PERFORMED: HST HISTORY: [name] is a 51 y.o. female ; The patient had been morbidly obese before she underwent gastric bypass surgery and had one sleep study probably 15 years ago which documented OSA and the need for CPAP. With the weight loss surgery her body mass index was reduced significantly. She lost a total of 140 pounds following surgery at [facility] in 2008.  In 2012 a second sleep study was ordered by Dr. Joretta Bachelor and interpreted by her, resulting in a recommendation that CPAP was no longer necessary. The patient also suffers from migraines since teenage. She thinks she may snore, she has not been recently told that she has snore or have apnea. Nonrestorative sleep. She does not report vivid dreams. Epworth Sleepiness score endorsed at 6 points , Fatigue severity score 23. STUDY RESULTS: Total Recording Time: 7h 67m Total Apnea/Hypopnea Index (AHI): 0.1/ hr Average Oxygen Saturation: SpO2 95 Lowest Oxygen Saturation: Nadir at 91%, no hypoxemia.  Average Heart Rate: 68 bpm- highly variable heart rate between 69 and 205 bpm  IMPRESSION: Sleep Apnea was not found, neither hypoxemia,  but a highly variable heart rate. No need for CPAP. RECOMMENDATION: Recommend cardiac monitoring. I certify that I have reviewed the raw data recording prior to the issuance of this report in accordance with the standards of the American Academy of Sleep Medicine (AASM). [name], MD   03-08-2017  [facility] [place], Tax adviser of Psychiatry and Neurology [place], [facility]

## 2017-03-09 NOTE — Telephone Encounter (Signed)
Pt returned my call. I advised her that Dr. Brett Fairy reviewed her sleep study and found that pt did not have any significant sleep apnea and does not qualify for a cpap based on these results. Pt asked that I send a copy of this sleep study to Dr. [name]. Pt declined a follow up with Dr. Brett Fairy at this time. Pt verbalized understanding of results. Pt had no questions at this time but was encouraged to call back if questions arise.

## 2017-03-18 ENCOUNTER — Other Ambulatory Visit (HOSPITAL_COMMUNITY): Payer: Self-pay | Admitting: Internal Medicine

## 2017-03-18 ENCOUNTER — Telehealth: Payer: Self-pay | Admitting: Nurse Practitioner

## 2017-03-18 ENCOUNTER — Other Ambulatory Visit: Payer: Self-pay | Admitting: Certified Nurse Midwife

## 2017-03-18 ENCOUNTER — Ambulatory Visit (HOSPITAL_COMMUNITY)
Admission: RE | Admit: 2017-03-18 | Discharge: 2017-03-18 | Disposition: A | Discharge status: Home / Self Care | Payer: BLUE CROSS/BLUE SHIELD | Source: Ambulatory Visit | Attending: Vascular Surgery | Admitting: Vascular Surgery

## 2017-03-18 DIAGNOSIS — R600 Localized edema: Secondary | ICD-10-CM

## 2017-03-18 DIAGNOSIS — Z1231 Encounter for screening mammogram for malignant neoplasm of breast: Secondary | ICD-10-CM

## 2017-03-18 NOTE — Telephone Encounter (Signed)
Left message regarding upcoming appointment has been canceled and needs to be rescheduled. °

## 2017-04-20 ENCOUNTER — Ambulatory Visit: Payer: BLUE CROSS/BLUE SHIELD | Admitting: Nurse Practitioner

## 2017-04-28 ENCOUNTER — Ambulatory Visit: Payer: BLUE CROSS/BLUE SHIELD | Admitting: Certified Nurse Midwife

## 2017-04-28 ENCOUNTER — Ambulatory Visit
Admission: RE | Admit: 2017-04-28 | Discharge: 2017-04-28 | Disposition: A | Discharge status: Home / Self Care | Payer: BLUE CROSS/BLUE SHIELD | Source: Ambulatory Visit | Attending: Certified Nurse Midwife | Admitting: Certified Nurse Midwife

## 2017-04-28 DIAGNOSIS — Z1231 Encounter for screening mammogram for malignant neoplasm of breast: Secondary | ICD-10-CM

## 2017-04-30 ENCOUNTER — Encounter: Payer: Self-pay | Admitting: Obstetrics & Gynecology

## 2017-04-30 ENCOUNTER — Ambulatory Visit (INDEPENDENT_AMBULATORY_CARE_PROVIDER_SITE_OTHER): Payer: BLUE CROSS/BLUE SHIELD | Admitting: Obstetrics & Gynecology

## 2017-04-30 VITALS — BP 126/74 | HR 82 | Resp 12 | Ht 64.25 in | Wt 179.5 lb

## 2017-04-30 DIAGNOSIS — Z01419 Encounter for gynecological examination (general) (routine) without abnormal findings: Secondary | ICD-10-CM | POA: Diagnosis not present

## 2017-04-30 DIAGNOSIS — N912 Amenorrhea, unspecified: Secondary | ICD-10-CM | POA: Diagnosis not present

## 2017-04-30 MED ORDER — NORETHIN-ETH ESTRAD-FE BIPHAS 1 MG-10 MCG / 10 MCG PO TABS: ORAL_TABLET | ORAL | 4 refills | Status: DC

## 2017-04-30 NOTE — Progress Notes (Signed)
51 y.o. G0P0000 Single Caucasian F here for annual exam.  Doing well.  Has questions about menopause.  Denies vaginal dryness.    PCP:  Dr. [name].  Last visit was early summer.  Blood work was negative.    No LMP recorded (lmp unknown). Patient is not currently having periods (Reason: Oral contraceptives).          Sexually active: No.  The current method of family planning is post menopausal status.    Exercising: No.  The patient does not participate in regular exercise at present. Smoker:  no  Health Maintenance: Pap:  04/02/15 negative, HR HPV negative  History of abnormal Pap:  no MMG:  04/28/17 BIRADS 1 negative  Colonoscopy:  07/10/15- tubular adenoma- repeat 5 years   BMD:  2017 at [place]:  01/12/10   Pneumonia vaccine(s):  never Zostavax:   never Hep C testing: not indicated  Screening Labs: PCP, Hb today: PCP   reports that she has never smoked. She has never used smokeless tobacco. She reports that she does not drink alcohol or use drugs.  Past Medical History:  Diagnosis Date  . Depression   . Fibrocystic breast   . Fibromyalgia    11/26/16 'resolved'  . Migraines   . Obesity   . Sleep apnea    not currently    Past Surgical History:  Procedure Laterality Date  . GASTRIC BYPASS  04/2007, revision 02/2010   "Mini" gastric bypass  . HERNIA REPAIR  2009   incisional repair from gastsric bypass  . LAPAROSCOPIC CHOLECYSTECTOMY  09/2002   Dr. March Rummage  . TONSILLECTOMY AND ADENOIDECTOMY  age  44  . WISDOM TOOTH EXTRACTION  age 38    Current Outpatient Prescriptions  Medication Sig Dispense Refill  . acetaminophen (TYLENOL) 160 MG/5ML elixir Take 20 mLs (640 mg total) by mouth every 8 (eight) hours as needed for fever. 120 mL 0  . ARIPiprazole (ABILIFY) 5 MG tablet Take 2.5 mg by mouth daily.    Marland Kitchen BRINTELLIX 20 MG TABS Take 20 mg by mouth daily.    Marland Kitchen buPROPion (WELLBUTRIN XL) 300 MG 24 hr tablet Take 1 tablet by mouth daily.    . cetirizine (ZYRTEC)  10 MG tablet Take 10 mg by mouth daily.    . clonazePAM (KLONOPIN) 0.5 MG tablet Take 0.5 mg by mouth 2 (two) times daily as needed for anxiety.    Marland Kitchen CREON 36000 units CPEP capsule   7  . cyanocobalamin 500 MCG tablet Take 500 mcg by mouth daily.    Marland Kitchen dutasteride (AVODART) 0.5 MG capsule   4  . ferrous sulfate 325 (65 FE) MG tablet Take 1 tablet by mouth daily.    Marland Kitchen ipratropium (ATROVENT) 0.03 % nasal spray USE 2 SPRAYS IN EACH NOSTRIL TID AS NEEDED  2  . lovastatin (MEVACOR) 20 MG tablet Take 20 mg by mouth once a week.    . minoxidil (ROGAINE) 2 % external solution Apply topically 2 (two) times daily.    . Multiple Vitamin (MULTIVITAMIN) tablet Take 1 tablet by mouth 2 (two) times daily.     . Multiple Vitamins-Minerals (OCUVITE EXTRA PO) Take 1 tablet by mouth daily.    . Norethindrone-Ethinyl Estradiol-Fe Biphas (LO LOESTRIN FE) 1 MG-10 MCG / 10 MCG tablet Take 1 tablet by mouth daily. 84 tablet 4  . SUMAtriptan (IMITREX) 100 MG tablet TAKE 1 TABLET BY MOUTH ONCE AS NEEDED FOR MIGRAINE(MAY REPEAT IN 2 HORUS. MAX OF 2  TABLETS PER 24 HOURS)    . triamterene-hydrochlorothiazide (MAXZIDE-25) 37.5-25 MG tablet   11  . Vitamin D, Ergocalciferol, (DRISDOL) 50000 UNITS CAPS Take 50,000 Units by mouth daily.      No current facility-administered medications for this visit.     Family History  Problem Relation Age of Onset  . Hyperlipidemia Mother   . Prostate cancer Father 86  . Thyroid disease Father   . Melanoma Brother 35       back of knee   . Colon cancer Maternal Grandfather 60       colon caner  . Stroke Maternal Grandmother   . Cancer Paternal Grandmother        liver  . Breast cancer Neg Hx     ROS:  Pertinent items are noted in HPI.  Otherwise, a comprehensive ROS was negative.  Exam:   BP 126/74 (BP Location: Right Arm, Patient Position: Sitting, Cuff Size: Normal)   Pulse 82   Resp 12   Ht 5' 4.25" (1.632 m)   Wt 179 lb 8 oz (81.4 kg)   LMP  (LMP Unknown)   BMI 30.57  kg/m   Weight change:    Height: 5' 4.25" (163.2 cm)  Ht Readings from Last 3 Encounters:  04/30/17 5' 4.25" (1.632 m)  11/26/16 5' 4.5" (1.638 m)  04/14/16 5' 4.75" (1.645 m)    General appearance: alert, cooperative and appears stated age Head: Normocephalic, without obvious abnormality, atraumatic Neck: no adenopathy, supple, symmetrical, trachea midline and thyroid normal to inspection and palpation Lungs: clear to auscultation bilaterally Breasts: normal appearance, no masses or tenderness Heart: regular rate and rhythm Abdomen: soft, non-tender; bowel sounds normal; no masses,  no organomegaly Extremities: extremities normal, atraumatic, no cyanosis or edema Skin: Skin color, texture, turgor normal. No rashes or lesions Lymph nodes: Cervical, supraclavicular, and axillary nodes normal. No abnormal inguinal nodes palpated Neurologic: Grossly normal   Pelvic: External genitalia:  no lesions              Urethra:  normal appearing urethra with no masses, tenderness or lesions              Bartholins and Skenes: normal                 Vagina: normal appearing vagina with normal color and discharge, no lesions              Cervix: no lesions              Pap taken: No. Bimanual Exam:  Uterus:  normal size, contour, position, consistency, mobility, non-tender              Adnexa: normal adnexa and no mass, fullness, tenderness               Rectovaginal: Confirms               Anus:  normal sphincter tone, no lesions  Chaperone was present for exam.  A:  Well Woman with normal exam H/O migraines, stable On OCPs for  H/o "mini" gastric bypass 8/08 with two revisions in 2009 and 2011 H/o depression H/o OSA, resolved with last testing Vit D deficiency  P:   Mammogram guidelines reviewed pap smear with neg HR HPV 2016.  Will repeat next year. AMH will be obtained today.  If in menopausal range, will plan to stop OCPs and see how she does off hormonal therapy Lab work and  vaccines UTD with Dr.  [name] return annually or prn

## 2017-05-04 LAB — ANTI MULLERIAN HORMONE: ANTI-MULLERIAN HORMONE (AMH): <0.015 ng/mL

## 2017-05-06 ENCOUNTER — Telehealth: Payer: Self-pay | Admitting: *Deleted

## 2017-05-06 NOTE — Telephone Encounter (Signed)
Call to patient. Results given to patient as seen below from Dr. Sabra Heck. Patient verbalized understanding. Patient aware to call office if she has any bleeding in the future. Patient also states she would like Dr. Sabra Heck to know she has had a migraine the last 2 days. RN advised this message would be sent to Dr. Sabra Heck and if any changes our office would be in touch.   Routing to provider for final review. Patient agreeable to disposition. Will close encounter.

## 2017-05-06 NOTE — Telephone Encounter (Signed)
-----   Message from Megan Salon, MD sent at 05/06/2017  3:14 PM EDT ----- Please let pt know her AMH was in menopausal range.  Ok to stop OCPs and see if she has any symptoms.  Does need to call if has any future bleeding.  Thanks.

## 2017-05-06 NOTE — Telephone Encounter (Signed)
Please advise pt to be seen in Urgent Care if headache persists.  Thanks.

## 2017-05-10 NOTE — Telephone Encounter (Signed)
Left message per DPR letting patient know to be seen at Urgent Care if headaches persisted. Advised patient to return call to office if she had any additional questions.

## 2018-03-21 ENCOUNTER — Other Ambulatory Visit: Payer: Self-pay | Admitting: Obstetrics & Gynecology

## 2018-03-21 DIAGNOSIS — Z1231 Encounter for screening mammogram for malignant neoplasm of breast: Secondary | ICD-10-CM

## 2018-04-29 ENCOUNTER — Ambulatory Visit
Admission: RE | Admit: 2018-04-29 | Discharge: 2018-04-29 | Disposition: A | Discharge status: Home / Self Care | Payer: BLUE CROSS/BLUE SHIELD | Source: Ambulatory Visit | Attending: Obstetrics & Gynecology | Admitting: Obstetrics & Gynecology

## 2018-04-29 DIAGNOSIS — Z1231 Encounter for screening mammogram for malignant neoplasm of breast: Secondary | ICD-10-CM

## 2018-07-01 ENCOUNTER — Other Ambulatory Visit: Payer: Self-pay

## 2018-07-01 ENCOUNTER — Other Ambulatory Visit (HOSPITAL_COMMUNITY)
Admission: RE | Admit: 2018-07-01 | Discharge: 2018-07-01 | Disposition: A | Discharge status: Home / Self Care | Payer: BLUE CROSS/BLUE SHIELD | Source: Ambulatory Visit | Attending: Obstetrics & Gynecology | Admitting: Obstetrics & Gynecology

## 2018-07-01 ENCOUNTER — Ambulatory Visit (INDEPENDENT_AMBULATORY_CARE_PROVIDER_SITE_OTHER): Payer: BLUE CROSS/BLUE SHIELD | Admitting: Obstetrics & Gynecology

## 2018-07-01 ENCOUNTER — Encounter: Payer: Self-pay | Admitting: Obstetrics & Gynecology

## 2018-07-01 VITALS — BP 112/80 | HR 76 | Resp 14 | Ht 64.25 in | Wt 191.2 lb

## 2018-07-01 DIAGNOSIS — Z124 Encounter for screening for malignant neoplasm of cervix: Secondary | ICD-10-CM | POA: Diagnosis not present

## 2018-07-01 DIAGNOSIS — Z01419 Encounter for gynecological examination (general) (routine) without abnormal findings: Secondary | ICD-10-CM | POA: Diagnosis not present

## 2018-07-01 NOTE — Progress Notes (Signed)
52 y.o. G0P0000 Single White or Caucasian female here for annual exam.  Stopped Loloestrin this past year.  Did not have any bleeding after stopping the OCPs.  Is using a vaginal lubricant.  Feels well.   PCP:  Dr. [name].  Did blood work today.  Has appt next week.    Patient's last menstrual period was 09/14/2010.          Sexually active: Yes.    The current method of family planning is post menopausal status.    Exercising: Yes.    walking, bike Smoker:  no  Health Maintenance: Pap:  04/02/15 Neg. HR HPV:neg  History of abnormal Pap:  no MMG:  04/29/18 BIRADS1:Neg  Colonoscopy:  06/2015 f/u 5 years  BMD:   09/02/10 TDaP:  2011 Pneumonia vaccine(s):  No Shingrix:   D/w shingrix vaccination with pt today Hep C testing: n/a Screening Labs: PCP   reports that she has never smoked. She has never used smokeless tobacco. She reports that she does not drink alcohol or use drugs.  Past Medical History:  Diagnosis Date  . Depression   . Fibrocystic breast   . Fibromyalgia    11/26/16 'resolved'  . Migraines   . Obesity   . Sleep apnea    not currently    Past Surgical History:  Procedure Laterality Date  . GASTRIC BYPASS  04/2007, revision 2009 and 2011   "Mini" gastric bypass  . HERNIA REPAIR  2009   incisional repair from gastsric bypass  . LAPAROSCOPIC CHOLECYSTECTOMY  09/2002   Dr. March Rummage  . TONSILLECTOMY AND ADENOIDECTOMY  age  71  . WISDOM TOOTH EXTRACTION  age 67    Current Outpatient Medications  Medication Sig Dispense Refill  . acetaminophen (TYLENOL) 160 MG/5ML elixir Take 20 mLs (640 mg total) by mouth every 8 (eight) hours as needed for fever. 120 mL 0  . ARIPiprazole (ABILIFY) 5 MG tablet Take 2.5 mg by mouth daily.    Marland Kitchen BRINTELLIX 20 MG TABS Take 20 mg by mouth daily.    Marland Kitchen buPROPion (WELLBUTRIN XL) 300 MG 24 hr tablet Take 1 tablet by mouth daily.    . Calcium 500-100 MG-UNIT CHEW Chew by mouth daily as needed.    . cetirizine (ZYRTEC) 10 MG tablet Take 10  mg by mouth daily.    . Cholecalciferol (VITAMIN D3) 400 UNIT/ML LIQD Take by mouth daily.    . clonazePAM (KLONOPIN) 0.5 MG tablet Take 0.5 mg by mouth 2 (two) times daily as needed for anxiety.    . cyanocobalamin 500 MCG tablet Take 500 mcg by mouth daily.    Marland Kitchen dutasteride (AVODART) 0.5 MG capsule   4  . ferrous sulfate 325 (65 FE) MG tablet Take 1 tablet by mouth daily.    Marland Kitchen glucosamine-chondroitin 500-400 MG tablet Take 1 tablet by mouth 3 (three) times daily.    Marland Kitchen ipratropium (ATROVENT) 0.03 % nasal spray USE 2 SPRAYS IN EACH NOSTRIL TID AS NEEDED  2  . lovastatin (MEVACOR) 20 MG tablet Take 20 mg by mouth once a week.    . meloxicam (MOBIC) 15 MG tablet daily as needed.  6  . Multiple Vitamin (MULTIVITAMIN) tablet Take 1 tablet by mouth 2 (two) times daily.     . Multiple Vitamins-Minerals (OCUVITE EXTRA PO) Take 1 tablet by mouth daily.    . Omega-3 Fatty Acids (FISH OIL) 1000 MG CAPS Take by mouth daily.    . propranolol (INDERAL) 10 MG tablet Take 1  tablet by mouth daily as needed.  12  . SUMAtriptan (IMITREX) 100 MG tablet TAKE 1 TABLET BY MOUTH ONCE AS NEEDED FOR MIGRAINE(MAY REPEAT IN 2 HORUS. MAX OF 2 TABLETS PER 24 HOURS)    . triamterene-hydrochlorothiazide (MAXZIDE-25) 37.5-25 MG tablet   11  . Vitamin D, Ergocalciferol, (DRISDOL) 50000 UNITS CAPS Take 50,000 Units by mouth daily.      No current facility-administered medications for this visit.     Family History  Problem Relation Age of Onset  . Hyperlipidemia Mother   . Prostate cancer Father 65  . Thyroid disease Father   . Melanoma Brother 35       back of knee   . Colon cancer Maternal Grandfather 60       colon caner  . Stroke Maternal Grandmother   . Pancreatic cancer Paternal Grandmother           . Breast cancer Neg Hx     Review of Systems  Genitourinary:       Night urination   Skin:       Hair loss  All other systems reviewed and are negative.   Exam:   BP 112/80 (BP Location: Right Arm,  Patient Position: Sitting, Cuff Size: Large)   Pulse 76   Resp 14   Ht 5' 4.25" (1.632 m)   Wt 191 lb 3.2 oz (86.7 kg)   LMP 09/14/2010   BMI 32.56 kg/m   Height:   Height: 5' 4.25" (163.2 cm)  Ht Readings from Last 3 Encounters:  07/01/18 5' 4.25" (1.632 m)  04/30/17 5' 4.25" (1.632 m)  11/26/16 5' 4.5" (1.638 m)    General appearance: alert, cooperative and appears stated age Head: Normocephalic, without obvious abnormality, atraumatic Neck: no adenopathy, supple, symmetrical, trachea midline and thyroid normal to inspection and palpation Lungs: clear to auscultation bilaterally Breasts: normal appearance, no masses or tenderness Heart: regular rate and rhythm Abdomen: soft, non-tender; bowel sounds normal; no masses,  no organomegaly Extremities: extremities normal, atraumatic, no cyanosis or edema Skin: Skin color, texture, turgor normal. No rashes or lesions Lymph nodes: Cervical, supraclavicular, and axillary nodes normal. No abnormal inguinal nodes palpated Neurologic: Grossly normal   Pelvic: External genitalia:  no lesions              Urethra:  normal appearing urethra with no masses, tenderness or lesions              Bartholins and Skenes: normal                 Vagina: normal appearing vagina with normal color and discharge, no lesions              Cervix: no lesions              Pap taken: Yes.   Bimanual Exam:  Uterus:  normal size, contour, position, consistency, mobility, non-tender              Adnexa: normal adnexa and no mass, fullness, tenderness               Rectovaginal: Confirms               Anus:  normal sphincter tone, no lesions  Chaperone was present for exam.  A:  Well Woman with normal exam PMP, no HRT H/o migraines H/O "mini" gastric bypass 8/08 with two revisions in 2009 and 2011 H/O depression H/O OSA that resolved with weight loss Vit D deficiency  P:   Mammogram guidelines reviewed.  Doing yearly. pap smear with HR HPV obtained  today Blood work just done this morning in Dr. Buel Ream office.  Has appt next week.   Colonoscopy due 2021.  Will plan BMD that year.  Tdap also due to be updated that year. Shingrix vaccination discussed and information provided regarding this vaccination and where to obtain it. return annually or prn

## 2018-07-05 LAB — CYTOLOGY - PAP
Diagnosis: NEGATIVE
HPV: NOT DETECTED

## 2018-08-02 ENCOUNTER — Telehealth: Payer: Self-pay | Admitting: *Deleted

## 2018-08-02 NOTE — Telephone Encounter (Signed)
[facility] form faxed to number provided.  Original form mailed back to patient today.  Copy to scan

## 2019-03-22 ENCOUNTER — Other Ambulatory Visit: Payer: Self-pay | Admitting: Obstetrics & Gynecology

## 2019-03-22 DIAGNOSIS — Z1231 Encounter for screening mammogram for malignant neoplasm of breast: Secondary | ICD-10-CM

## 2019-05-04 ENCOUNTER — Other Ambulatory Visit: Payer: Self-pay

## 2019-05-04 ENCOUNTER — Ambulatory Visit
Admission: RE | Admit: 2019-05-04 | Discharge: 2019-05-04 | Disposition: A | Discharge status: Home / Self Care | Payer: BLUE CROSS/BLUE SHIELD | Source: Ambulatory Visit | Attending: Obstetrics & Gynecology | Admitting: Obstetrics & Gynecology

## 2019-05-04 DIAGNOSIS — Z1231 Encounter for screening mammogram for malignant neoplasm of breast: Secondary | ICD-10-CM

## 2019-11-01 ENCOUNTER — Other Ambulatory Visit: Payer: Self-pay

## 2019-11-03 ENCOUNTER — Ambulatory Visit (INDEPENDENT_AMBULATORY_CARE_PROVIDER_SITE_OTHER): Payer: BC Managed Care – PPO | Admitting: Obstetrics & Gynecology

## 2019-11-03 ENCOUNTER — Other Ambulatory Visit: Payer: Self-pay

## 2019-11-03 ENCOUNTER — Encounter: Payer: Self-pay | Admitting: Obstetrics & Gynecology

## 2019-11-03 VITALS — BP 128/70 | HR 76 | Temp 97.9°F | Resp 10 | Ht 64.0 in | Wt 212.4 lb

## 2019-11-03 DIAGNOSIS — L292 Pruritus vulvae: Secondary | ICD-10-CM

## 2019-11-03 DIAGNOSIS — Z23 Encounter for immunization: Secondary | ICD-10-CM | POA: Diagnosis not present

## 2019-11-03 DIAGNOSIS — Z01419 Encounter for gynecological examination (general) (routine) without abnormal findings: Secondary | ICD-10-CM | POA: Diagnosis not present

## 2019-11-03 MED ORDER — ESTRADIOL 0.1 MG/GM VA CREA: TOPICAL_CREAM | VAGINAL | 3 refills | Status: DC

## 2019-11-03 MED ORDER — CLOBETASOL PROPIONATE 0.05 % EX OINT: 1.0000 "application " | TOPICAL_OINTMENT | Freq: Every day | CUTANEOUS | 0 refills | Status: DC

## 2019-11-03 NOTE — Progress Notes (Signed)
54 y.o. G0P0000 Single White or Caucasian female here for annual exam.  Doing well.  Denies vaginal bleeding.  Had episodic chills this past year.  Had multiple tests for Covid because of this.  Has done multiple blood work tests.  All of it was normal.  He considered doing a CT of the abdomen/pelvis.  Most of the blood work was done in November or December.    Dating someone new.  Not SA.    PCP:  Dr. [name].    No LMP recorded (lmp unknown). Patient is postmenopausal.          Sexually active: No.  The current method of family planning is post menopausal status.    Exercising: No.  The patient does not participate in regular exercise at present. Smoker:  no  Health Maintenance: Pap:   07/01/18 Neg:Neg HR HPV  04/02/15 Neg. HR HPV:neg  History of abnormal Pap:  no MMG:  05/04/19 BIRADS 1 negative/density b Colonoscopy:  06/2015 f/u 5 years, Dr. Havery Moros.    BMD:   Done about 2018 or 2019 at [place]:  2011 Pneumonia vaccine(s):  n/a Shingrix:   completed Hep C testing: n/a Screening Labs: PCP   reports that she has never smoked. She has never used smokeless tobacco. She reports that she does not drink alcohol or use drugs.  Past Medical History:  Diagnosis Date  . Depression   . Fibrocystic breast   . Fibromyalgia    11/26/16 'resolved'  . Migraines   . Obesity   . Sleep apnea    not currently    Past Surgical History:  Procedure Laterality Date  . GASTRIC BYPASS  04/2007, revision 2009 and 2011   "Mini" gastric bypass  . HERNIA REPAIR  2009   incisional repair from gastsric bypass  . LAPAROSCOPIC CHOLECYSTECTOMY  09/2002   Dr. March Rummage  . TONSILLECTOMY AND ADENOIDECTOMY  age  66  . WISDOM TOOTH EXTRACTION  age 35    Current Outpatient Medications  Medication Sig Dispense Refill  . acetaminophen (TYLENOL) 160 MG/5ML elixir Take 20 mLs (640 mg total) by mouth every 8 (eight) hours as needed for fever. 120 mL 0  . ARIPiprazole (ABILIFY) 5 MG tablet Take  2.5 mg by mouth daily.    Marland Kitchen BRINTELLIX 20 MG TABS Take 20 mg by mouth daily.    Marland Kitchen buPROPion (WELLBUTRIN XL) 300 MG 24 hr tablet Take 1 tablet by mouth daily.    . Calcium 500-100 MG-UNIT CHEW Chew by mouth daily as needed.    . cetirizine (ZYRTEC) 10 MG tablet Take 10 mg by mouth daily.    . Cholecalciferol (VITAMIN D3) 400 UNIT/ML LIQD Take by mouth daily.    . clonazePAM (KLONOPIN) 0.5 MG tablet Take 0.5 mg by mouth 2 (two) times daily as needed for anxiety.    . cyanocobalamin 500 MCG tablet Take 500 mcg by mouth daily.    . famotidine (PEPCID) 20 MG tablet Take 20 mg by mouth 2 (two) times daily.    . ferrous sulfate 325 (65 FE) MG tablet Take 1 tablet by mouth daily.    Marland Kitchen ipratropium (ATROVENT) 0.03 % nasal spray USE 2 SPRAYS IN EACH NOSTRIL TID AS NEEDED  2  . lovastatin (MEVACOR) 20 MG tablet Take 20 mg by mouth once a week.    . meloxicam (MOBIC) 15 MG tablet daily as needed.  6  . Multiple Vitamin (MULTIVITAMIN) tablet Take 1 tablet by mouth 2 (two) times daily.     Marland Kitchen  Multiple Vitamins-Minerals (OCUVITE EXTRA PO) Take 1 tablet by mouth daily.    . Omega-3 Fatty Acids (FISH OIL) 1000 MG CAPS Take by mouth daily.    . propranolol (INDERAL) 10 MG tablet Take 1 tablet by mouth daily as needed.  12  . SUMAtriptan (IMITREX) 100 MG tablet TAKE 1 TABLET BY MOUTH ONCE AS NEEDED FOR MIGRAINE(MAY REPEAT IN 2 HORUS. MAX OF 2 TABLETS PER 24 HOURS)    . Vitamin D, Ergocalciferol, (DRISDOL) 50000 UNITS CAPS Take 50,000 Units by mouth daily.     Marland Kitchen glucosamine-chondroitin 500-400 MG tablet Take 1 tablet by mouth 3 (three) times daily.     No current facility-administered medications for this visit.    Family History  Problem Relation Age of Onset  . Hyperlipidemia Mother   . Prostate cancer Father 20  . Thyroid disease Father   . Melanoma Brother 35       back of knee   . Colon cancer Maternal Grandfather 60       colon caner  . Stroke Maternal Grandmother   . Pancreatic cancer Paternal  Grandmother           . Breast cancer Neg Hx     Review of Systems  All other systems reviewed and are negative.   Exam:   BP 128/70 (BP Location: Left Arm, Patient Position: Sitting, Cuff Size: Normal)   Pulse 76   Temp 97.9 F (36.6 C) (Temporal)   Resp 10   Ht 5\' 4"  (1.626 m)   Wt 212 lb 6.4 oz (96.3 kg)   LMP  (LMP Unknown)   BMI 36.46 kg/m   Height: 5\' 4"  (162.6 cm)  Ht Readings from Last 3 Encounters:  11/03/19 5\' 4"  (1.626 m)  07/01/18 5' 4.25" (1.632 m)  04/30/17 5' 4.25" (1.632 m)    General appearance: alert, cooperative and appears stated age Head: Normocephalic, without obvious abnormality, atraumatic Neck: no adenopathy, supple, symmetrical, trachea midline and thyroid normal to inspection and palpation Lungs: clear to auscultation bilaterally Breasts: normal appearance, no masses or tenderness Heart: regular rate and rhythm Abdomen: soft, non-tender; bowel sounds normal; no masses,  no organomegaly Extremities: extremities normal, atraumatic, no cyanosis or edema Skin: Skin color, texture, turgor normal. No rashes or lesions Lymph nodes: Cervical, supraclavicular, and axillary nodes normal. No abnormal inguinal nodes palpated Neurologic: Grossly normal   Pelvic: External genitalia:  no lesions except for hypopigmented skin changes perirectally              Urethra:  normal appearing urethra with no masses, tenderness or lesions              Bartholins and Skenes: normal                 Vagina: normal appearing vagina with normal color and discharge, no lesions              Cervix: no lesions              Pap taken: No. Bimanual Exam:  Uterus:  normal size, contour, position, consistency, mobility, non-tender              Adnexa: normal adnexa and no mass, fullness, tenderness               Rectovaginal: Confirms               Anus:  normal sphincter tone, no lesions  Chaperone, Terence Lux, CMA, was present for exam.  A:  Well Woman with normal  exam PMP, no HRT H/o migraines H/o "mini" gastric bypass 8/08 with two revision in 2009 and 2011 H/o depression H/o OSA that resolved with weight loss Vit D deficiency Perirectal skin changes c/w lichen planus/lichen sclerosus  P:   Mammogram guidelines reviewed.  Doing yearly. pap smear with neg HR HPV 2019 Release signed for blood work and BMD. Colonoscopy due later this year.  She is aware. Tdap updated today Will start estrace cream 1 gm pv twice weekly.  #42.5gram/1RF Return in one month and have vulvar biopsy Return annually or prn

## 2019-11-17 ENCOUNTER — Ambulatory Visit: Payer: Self-pay | Attending: Internal Medicine

## 2019-11-17 DIAGNOSIS — Z23 Encounter for immunization: Secondary | ICD-10-CM | POA: Insufficient documentation

## 2019-11-17 NOTE — Progress Notes (Signed)
   Covid-19 Vaccination Clinic  Name:  Katherine Todd    MRN: [id number] DOB: 12/12/1965  11/17/2019  [name] was observed post Covid-19 immunization for 15 minutes without incident. She was provided with Vaccine Information Sheet and instruction to access the V-Safe system.   [name] was instructed to call 911 with any severe reactions post vaccine: Marland Kitchen Difficulty breathing  . Swelling of face and throat  . A fast heartbeat  . A bad rash all over body  . Dizziness and weakness

## 2019-12-04 ENCOUNTER — Ambulatory Visit (INDEPENDENT_AMBULATORY_CARE_PROVIDER_SITE_OTHER): Payer: BC Managed Care – PPO | Admitting: Obstetrics & Gynecology

## 2019-12-04 ENCOUNTER — Other Ambulatory Visit: Payer: Self-pay

## 2019-12-04 ENCOUNTER — Encounter: Payer: Self-pay | Admitting: Certified Nurse Midwife

## 2019-12-04 ENCOUNTER — Encounter: Payer: Self-pay | Admitting: Obstetrics & Gynecology

## 2019-12-04 VITALS — BP 120/70 | HR 84 | Temp 98.1°F | Ht 64.0 in | Wt 211.6 lb

## 2019-12-04 DIAGNOSIS — L292 Pruritus vulvae: Secondary | ICD-10-CM

## 2019-12-04 DIAGNOSIS — N952 Postmenopausal atrophic vaginitis: Secondary | ICD-10-CM

## 2019-12-04 NOTE — Patient Instructions (Signed)
Taper off topical steroid by using every other night for two weeks and then Mon/Thur for 2 weeks and then stop.

## 2019-12-04 NOTE — Progress Notes (Signed)
GYNECOLOGY  VISIT  HPI: 54 y.o. G0P0000 Single White or Caucasian female here for 1 month recheck and vulvar bx.  Has been using vaginal estrogen cream twice weekly.  Doing really well.  Denies any itching or irritation.    Has some questions about lab work from Dr. [name].  Advised I was only able to get a BMP.  Pt feels she had a lot more blood work done for episode of chills that occurred last year.  She could pull up portal for [facility] and there was only a BMP for lab work in February.  She did have an ANA, sed rate done in the fall which I reviewed.  We discussed possible other lab work that could be done.  She hasn't had symptoms now in a few months so may not be a helpful as if was done when having the symptoms.  She has follow up scheduled in April with Dr. [name] and will consider if she needs to have any additional blood work done, may consider having it done then.  Still in "new" relationship.  Not SA.  GYNECOLOGIC HISTORY: No LMP recorded (lmp unknown). Patient is postmenopausal. Contraception: Postmenopausal Menopausal hormone therapy: Estradiol  Patient Active Problem List   Diagnosis Date Noted  . On selective serotonin reuptake inhibitor (SSRI) therapy 11/26/2016  . Chronically on benzodiazepine therapy 11/26/2016  . Snoring 11/26/2016  . Sleep related headaches 11/26/2016  . Hair loss disorder 04/14/2016  . Vitamin D deficiency 04/14/2016  . History of gastric bypass 04/14/2016  . Migraine without aura 05/06/2012    Past Medical History:  Diagnosis Date  . Depression   . Fibrocystic breast   . Fibromyalgia    11/26/16 'resolved'  . Migraines   . Obesity   . Sleep apnea    not currently    Past Surgical History:  Procedure Laterality Date  . GASTRIC BYPASS  04/2007, revision 2009 and 2011   "Mini" gastric bypass  . HERNIA REPAIR  2009   incisional repair from gastsric bypass  . LAPAROSCOPIC CHOLECYSTECTOMY  09/2002   Dr. March Rummage  . TONSILLECTOMY  AND ADENOIDECTOMY  age  3  . WISDOM TOOTH EXTRACTION  age 54    MEDS:   Current Outpatient Medications on File Prior to Visit  Medication Sig Dispense Refill  . ARIPiprazole (ABILIFY) 5 MG tablet Take 2.5 mg by mouth daily.    Marland Kitchen BRINTELLIX 20 MG TABS Take 20 mg by mouth daily.    Marland Kitchen buPROPion (WELLBUTRIN XL) 300 MG 24 hr tablet Take 1 tablet by mouth daily.    . Calcium 500-100 MG-UNIT CHEW Chew by mouth daily as needed.    . cetirizine (ZYRTEC) 10 MG tablet Take 10 mg by mouth daily.    . Cholecalciferol (VITAMIN D3) 400 UNIT/ML LIQD Take by mouth daily.    . clobetasol ointment (TEMOVATE) [id number] % Apply 1 application topically at bedtime. Apply topically nightly 30 g 0  . clonazePAM (KLONOPIN) 0.5 MG tablet Take 0.5 mg by mouth 2 (two) times daily as needed for anxiety.    . cyanocobalamin 500 MCG tablet Take 500 mcg by mouth daily.    Marland Kitchen estradiol (ESTRACE) 0.1 MG/GM vaginal cream 1 gram vaginally twice weekly 42.5 g 3  . famotidine (PEPCID) 20 MG tablet Take 20 mg by mouth 2 (two) times daily.    . ferrous sulfate 325 (65 FE) MG tablet Take 1 tablet by mouth daily.    Marland Kitchen ipratropium (ATROVENT) 0.03 % nasal spray  USE 2 SPRAYS IN EACH NOSTRIL TID AS NEEDED  2  . lovastatin (MEVACOR) 20 MG tablet Take 20 mg by mouth once a week.    . meloxicam (MOBIC) 15 MG tablet daily as needed.  6  . Multiple Vitamin (MULTIVITAMIN) tablet Take 1 tablet by mouth 2 (two) times daily.     . Multiple Vitamins-Minerals (OCUVITE EXTRA PO) Take 1 tablet by mouth daily.    . Omega-3 Fatty Acids (FISH OIL) 1000 MG CAPS Take by mouth daily.    . propranolol (INDERAL) 10 MG tablet Take 1 tablet by mouth daily as needed.  12  . SUMAtriptan (IMITREX) 100 MG tablet TAKE 1 TABLET BY MOUTH ONCE AS NEEDED FOR MIGRAINE(MAY REPEAT IN 2 HORUS. MAX OF 2 TABLETS PER 24 HOURS)    . Vitamin D, Ergocalciferol, (DRISDOL) 50000 UNITS CAPS Take 50,000 Units by mouth daily.      No current facility-administered medications on file  prior to visit.    ALLERGIES: [employer] diphedryl [diphenhydramine], Atorvastatin, and Adhesive [tape]  Family History  Problem Relation Age of Onset  . Hyperlipidemia Mother   . Prostate cancer Father 80  . Thyroid disease Father   . Melanoma Brother 35       back of knee   . Colon cancer Maternal Grandfather 60       colon caner  . Stroke Maternal Grandmother   . Pancreatic cancer Paternal Grandmother           . Breast cancer Neg Hx     SH:  Single, non smoker  Review of Systems  All other systems reviewed and are negative.   PHYSICAL EXAMINATION:    BP 120/70 (BP Location: Left Arm, Patient Position: Sitting, Cuff Size: Large)   Pulse 84   Temp 98.1 F (36.7 C) (Temporal)   Ht 5\' 4"  (1.626 m)   Wt 211 lb 9.6 oz (96 kg)   LMP  (LMP Unknown)   BMI 36.32 kg/m     General appearance: alert, cooperative and appears stated age Lymph:  no inguinal LAD noted  Pelvic: External genitalia:  no lesions              Urethra:  normal appearing urethra with no masses, tenderness or lesions              Bartholins and Skenes: normal                 Vagina: normal appearing vagina with normal color and discharge, no lesions              Anus:  normal sphincter tone, no lesions  Chaperone, Terence Lux, CMA, was present for exam.  Assessment: Pt has complete resolution of skin changes with steroid use this past month.  Although had planned biopsy today, with normal appearing skin now, do not feel this is needed Improved vaginal tissue as well with estrogen cream use H/o episodic chills which have also not occurred again since I last saw pt  Plan: Pt advised how to taper clobetasol.  With then stop.  Recheck at [facility] next year Return for any new concerns/symptoms Continue vaginal estrogen cream 1-2 doses weekly She is going to consider whether she needs additional blood work with appt with Dr. [name] next month.   20 minutes total time spent with pt.

## 2019-12-07 ENCOUNTER — Encounter: Payer: Self-pay | Admitting: Obstetrics & Gynecology

## 2019-12-19 ENCOUNTER — Ambulatory Visit: Payer: Self-pay | Attending: Internal Medicine

## 2019-12-19 DIAGNOSIS — Z23 Encounter for immunization: Secondary | ICD-10-CM

## 2019-12-19 NOTE — Progress Notes (Signed)
   Covid-19 Vaccination Clinic  Name:  Katherine Todd    MRN: [id number] DOB: Apr 23, 1966  12/19/2019  [name] was observed post Covid-19 immunization for 15 minutes without incident. She was provided with Vaccine Information Sheet and instruction to access the V-Safe system.   [name] was instructed to call 911 with any severe reactions post vaccine: Marland Kitchen Difficulty breathing  . Swelling of face and throat  . A fast heartbeat  . A bad rash all over body  . Dizziness and weakness   Immunizations Administered    Name Date Dose VIS Date Route   Pfizer COVID-19 Vaccine 12/19/2019  8:09 AM 0.3 mL 08/25/2019 Intramuscular   Manufacturer: [employer], [employer]   Lot: [id number]   [place]: [id number]

## 2020-02-08 ENCOUNTER — Encounter: Payer: Self-pay | Admitting: Obstetrics & Gynecology

## 2020-02-08 ENCOUNTER — Telehealth: Payer: Self-pay | Admitting: Obstetrics & Gynecology

## 2020-02-08 NOTE — Telephone Encounter (Signed)
[place], [place] "[place]"  [facility]  Phone Number: [phone]  Dr. Sabra Heck,  Just wanted to let you know that I had one of those strange fever spikes again last night. Dr. [name] has referred me for an infectious disease consult. (This has just happened; I don't know to whom he has referred me.)   Hope you're doing well!   Katherina Right

## 2020-02-08 NOTE — Telephone Encounter (Signed)
Patient was seen in office on 12/04/19.   MyChart message to patient advising to continue with recommendations from PCP. Will f/u if any additional recommendations from Dr. Sabra Heck.   Routing to Dr. Lestine Box.   Encounter closed.

## 2020-02-15 ENCOUNTER — Ambulatory Visit (INDEPENDENT_AMBULATORY_CARE_PROVIDER_SITE_OTHER): Payer: BC Managed Care – PPO | Admitting: Internal Medicine

## 2020-02-15 ENCOUNTER — Encounter: Payer: Self-pay | Admitting: Internal Medicine

## 2020-02-15 ENCOUNTER — Other Ambulatory Visit: Payer: Self-pay

## 2020-02-15 DIAGNOSIS — R509 Fever, unspecified: Secondary | ICD-10-CM | POA: Diagnosis not present

## 2020-02-15 NOTE — Progress Notes (Signed)
[facility] for Infectious Disease  Reason for Consult: Intermittent fever, chills and fatigue over the past year Referring Provider: Dr. Valetta Fuller  Assessment: Golden Circle has had 5 episodes of low-grade fever, chills, muscle aches and fatigue over the past year.  I am not absolutely convinced that all of these are caused by the same thing.  2 were associated with a dental discomfort.  All resolved following every 36 hours.  I will obtain all of her lab work from Dr. [name] for review.  I told her that I do not find anything alarming from her history or exam.  I think significant infectious disease, cancer and autoimmune disorders are all very unlikely given that she has great stretches of time between these episodes and has no other abnormal findings on exam or apparently from recent lab work.  Encouraged her to continue all of her usual activities and continue to keep a log of her symptoms.  Plan: 1. Observation for now 2. Obtain copies of her lab results 3. Follow-up in 4 to 6 weeks  Patient Active Problem List   Diagnosis Date Noted  . On selective serotonin reuptake inhibitor (SSRI) therapy 11/26/2016  . Chronically on benzodiazepine therapy 11/26/2016  . Snoring 11/26/2016  . Sleep related headaches 11/26/2016  . Hair loss disorder 04/14/2016  . Vitamin D deficiency 04/14/2016  . History of gastric bypass 04/14/2016  . Migraine without aura 05/06/2012    Patient's Medications  New Prescriptions   No medications on file  Previous Medications   ARIPIPRAZOLE (ABILIFY) 5 MG TABLET    Take 2.5 mg by mouth daily.   BRINTELLIX 20 MG TABS    Take 20 mg by mouth daily.   BUPROPION (WELLBUTRIN XL) 300 MG 24 HR TABLET    Take 1 tablet by mouth daily.   CALCIUM 500-100 MG-UNIT CHEW    Chew by mouth daily as needed.   CETIRIZINE (ZYRTEC) 10 MG TABLET    Take 10 mg by mouth daily.   CHOLECALCIFEROL (VITAMIN D3) 400 UNIT/ML LIQD    Take by mouth daily.   CLOBETASOL  OINTMENT (TEMOVATE) 0.05 %    Apply 1 application topically at bedtime. Apply topically nightly   CLONAZEPAM (KLONOPIN) 0.5 MG TABLET    Take 0.5 mg by mouth 2 (two) times daily as needed for anxiety.   CYANOCOBALAMIN 500 MCG TABLET    Take 500 mcg by mouth daily.   ESTRADIOL (ESTRACE) 0.1 MG/GM VAGINAL CREAM    1 gram vaginally twice weekly   FAMOTIDINE (PEPCID) 20 MG TABLET    Take 20 mg by mouth 2 (two) times daily.   FERROUS SULFATE 325 (65 FE) MG TABLET    Take 1 tablet by mouth daily.   IPRATROPIUM (ATROVENT) 0.03 % NASAL SPRAY    USE 2 SPRAYS IN EACH NOSTRIL TID AS NEEDED   LOVASTATIN (MEVACOR) 20 MG TABLET    Take 20 mg by mouth once a week.   MELOXICAM (MOBIC) 15 MG TABLET    daily as needed.   MULTIPLE VITAMIN (MULTIVITAMIN) TABLET    Take 1 tablet by mouth 2 (two) times daily.    MULTIPLE VITAMINS-MINERALS (OCUVITE EXTRA PO)    Take 1 tablet by mouth daily.   OMEGA-3 FATTY ACIDS (FISH OIL) 1000 MG CAPS    Take by mouth daily.   PROPRANOLOL (INDERAL) 10 MG TABLET    Take 1 tablet by mouth daily as needed.   SUMATRIPTAN (IMITREX) 100 MG  TABLET    TAKE 1 TABLET BY MOUTH ONCE AS NEEDED FOR MIGRAINE(MAY REPEAT IN 2 HORUS. MAX OF 2 TABLETS PER 24 HOURS)   VITAMIN D, ERGOCALCIFEROL, (DRISDOL) 50000 UNITS CAPS    Take 50,000 Units by mouth daily.   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  This began abruptly on 02/27/2019.  She recalls taking Tylenol and her symptoms resolved within 24 hours.  She did well until 04/03/2019 when she had a similar episode.  She felt feverish but did not take her temperature.  She broke a tooth and had a temporary crown placed in mid October.  4 days later on 07/03/2019 she had a third episode with a low-grade temperature of 100.5 degrees and chills that again resolved within  24 hours.  She was treated for a possible dental infection on 08/24/2019.  She was given penicillin.  The following day she had a temperature of 100.5 degrees associated with upper arm pain.  She felt better the following day she did well until 02/07/2020 when she felt feverish and had a temperature of 99.5 degrees.  She had fatigue and upper arm soreness again that she thought might have been due to recent strength training.  Symptoms resolved within 36 hours.  She has been tested for Covid on multiple occasions and all have been negative.  She received her Covid vaccine in late April and May.  She did have a high fever of 102.5 degrees shortly after her second dose.  She is not on any new medications.  She says that she has gained weight over the last year.  Records from Dr. [name] indicate that she has undergone extensive laboratory testing and that the only remarkable finding was a sedimentation rate of 40.  He mentioned that an ANA was negative.  She tells me that Dr. [name] had mentioned the possibility of doing an abdominal CT scan to look for any evidence of cancer and she has been worried about that.  Review of Systems: Review of Systems  Constitutional: Positive for chills, fever and malaise/fatigue. Negative for diaphoresis and weight loss.  HENT: Negative for congestion, ear pain and sore throat.        She has had no change in her seasonal allergy symptoms.  Eyes: Positive for redness. Negative for blurred vision, pain and discharge.  Respiratory: Negative for cough, sputum production and shortness of breath.   Cardiovascular: Negative for chest pain.  Gastrointestinal: Positive for nausea and vomiting. Negative for abdominal pain and diarrhea.       She says that she will occasionally have nausea and vomiting if she eats too much and is under stress from work.  This is been a chronic problem.  Genitourinary: Negative for dysuria, frequency and urgency.  Musculoskeletal: Positive for  myalgias. Negative for back pain and joint pain.  Skin: Negative for rash.  Neurological: Negative for focal weakness and headaches.  Psychiatric/Behavioral: Positive for depression.      Past Medical History:  Diagnosis Date  . Depression   . Fibrocystic breast   . Fibromyalgia    11/26/16 'resolved'  . Migraines   . Obesity   . Sleep apnea    not currently    Social History   Tobacco Use  . Smoking status: Never Smoker  .  Smokeless tobacco: Never Used  Substance Use Topics  . Alcohol use: No  . Drug use: No    Family History  Problem Relation Age of Onset  . Hyperlipidemia Mother   . Prostate cancer Father 30  . Thyroid disease Father   . Melanoma Brother 35       back of knee   . Colon cancer Maternal Grandfather 60       colon caner  . Stroke Maternal Grandmother   . Pancreatic cancer Paternal Grandmother           . Breast cancer Neg Hx    Allergies  Allergen Reactions  . [employer] Diphedryl [Diphenhydramine] Other (See Comments)    Heart palpitations  Heart palpitations   . Atorvastatin Other (See Comments)    Lipitor= Muscle aches  . Adhesive [Tape] Rash    OBJECTIVE: Vitals:   02/15/20 0844  BP: 133/88  Pulse: 83  Temp: 97.6 F (36.4 C)  SpO2: 96%  Weight: 217 lb (98.4 kg)  Height: 5' 4.5" (1.638 m)   Body mass index is 36.67 kg/m.   Physical Exam Constitutional:      Comments: She is in good spirits and appears quite healthy.  HENT:     Mouth/Throat:     Pharynx: No oropharyngeal exudate or posterior oropharyngeal erythema.  Eyes:     Conjunctiva/sclera: Conjunctivae normal.  Cardiovascular:     Rate and Rhythm: Normal rate and regular rhythm.     Heart sounds: Normal heart sounds. No murmur.  Pulmonary:     Effort: Pulmonary effort is normal.     Breath sounds: Normal breath sounds.  Abdominal:     General: There is no distension.     Palpations: Abdomen is soft. There is no mass.     Tenderness: There is no abdominal  tenderness.  Musculoskeletal:        General: No swelling or tenderness.  Lymphadenopathy:     Head:     Right side of head: No submandibular adenopathy.     Left side of head: No submandibular adenopathy.     Cervical: No cervical adenopathy.     Upper Body:     Right upper body: No supraclavicular, axillary or epitrochlear adenopathy.     Left upper body: No supraclavicular, axillary or epitrochlear adenopathy.  Skin:    Findings: No rash.  Neurological:     General: No focal deficit present.  Psychiatric:        Mood and Affect: Mood normal.     Microbiology: No results found for this or any previous visit (from the past 240 hour(s)).  Michel Bickers, MD [facility] for [place] [facility] [phone] pager   [phone] cell 02/15/2020, 9:17 AM

## 2020-03-21 ENCOUNTER — Other Ambulatory Visit: Payer: Self-pay | Admitting: Obstetrics & Gynecology

## 2020-03-21 DIAGNOSIS — Z1231 Encounter for screening mammogram for malignant neoplasm of breast: Secondary | ICD-10-CM

## 2020-04-03 ENCOUNTER — Encounter: Payer: Self-pay | Admitting: Internal Medicine

## 2020-04-03 ENCOUNTER — Ambulatory Visit (INDEPENDENT_AMBULATORY_CARE_PROVIDER_SITE_OTHER): Payer: BC Managed Care – PPO | Admitting: Internal Medicine

## 2020-04-03 ENCOUNTER — Other Ambulatory Visit: Payer: Self-pay

## 2020-04-03 DIAGNOSIS — R509 Fever, unspecified: Secondary | ICD-10-CM | POA: Diagnosis not present

## 2020-04-03 NOTE — Assessment & Plan Note (Signed)
Recent temperatures that she has recorded fall within a normal range.  I do not find any evidence to suggest that she actually has fever of unknown origin.  She seemed very reassured by this.  I do not feel any further testing or treatment for infection is indicated at this time.

## 2020-04-03 NOTE — Progress Notes (Signed)
[facility] for Infectious Disease  Patient Active Problem List   Diagnosis Date Noted  . Intermittent fever of unknown origin 02/15/2020    Priority: High  . On selective serotonin reuptake inhibitor (SSRI) therapy 11/26/2016  . Chronically on benzodiazepine therapy 11/26/2016  . Snoring 11/26/2016  . Sleep related headaches 11/26/2016  . Hair loss disorder 04/14/2016  . Vitamin D deficiency 04/14/2016  . History of gastric bypass 04/14/2016  . Migraine without aura 05/06/2012    Patient's Medications  New Prescriptions   No medications on file  Previous Medications   ARIPIPRAZOLE (ABILIFY) 5 MG TABLET    Take 2.5 mg by mouth daily.   BRINTELLIX 20 MG TABS    Take 20 mg by mouth daily.   BUPROPION (WELLBUTRIN XL) 300 MG 24 HR TABLET    Take 1 tablet by mouth daily.   CALCIUM 500-100 MG-UNIT CHEW    Chew by mouth daily as needed.   CETIRIZINE (ZYRTEC) 10 MG TABLET    Take 10 mg by mouth daily.   CHOLECALCIFEROL (VITAMIN D3) 400 UNIT/ML LIQD    Take by mouth daily.   CLOBETASOL OINTMENT (TEMOVATE) 0.05 %    Apply 1 application topically at bedtime. Apply topically nightly   CLONAZEPAM (KLONOPIN) 0.5 MG TABLET    Take 0.5 mg by mouth 2 (two) times daily as needed for anxiety.   CYANOCOBALAMIN 500 MCG TABLET    Take 500 mcg by mouth daily.   ESTRADIOL (ESTRACE) 0.1 MG/GM VAGINAL CREAM    1 gram vaginally twice weekly   FAMOTIDINE (PEPCID) 20 MG TABLET    Take 20 mg by mouth 2 (two) times daily.   FERROUS SULFATE 325 (65 FE) MG TABLET    Take 1 tablet by mouth daily.   IPRATROPIUM (ATROVENT) 0.03 % NASAL SPRAY    USE 2 SPRAYS IN EACH NOSTRIL TID AS NEEDED   LOVASTATIN (MEVACOR) 20 MG TABLET    Take 20 mg by mouth once a week.   MELOXICAM (MOBIC) 15 MG TABLET    daily as needed.   MULTIPLE VITAMIN (MULTIVITAMIN) TABLET    Take 1 tablet by mouth 2 (two) times daily.    MULTIPLE VITAMINS-MINERALS (OCUVITE EXTRA PO)    Take 1 tablet by mouth daily.   OMEGA-3 FATTY ACIDS  (FISH OIL) 1000 MG CAPS    Take by mouth daily.   PROPRANOLOL (INDERAL) 10 MG TABLET    Take 1 tablet by mouth daily as needed.   SUMATRIPTAN (IMITREX) 100 MG TABLET    TAKE 1 TABLET BY MOUTH ONCE AS NEEDED FOR MIGRAINE(MAY REPEAT IN 2 HORUS. MAX OF 2 TABLETS PER 24 HOURS)   VITAMIN D, ERGOCALCIFEROL, (DRISDOL) 50000 UNITS CAPS    Take 50,000 Units by mouth daily.   Modified Medications   No medications on file  Discontinued Medications   No medications on file    Subjective: Katherine Todd is in for her routine follow-up visit.  She began having intermittent low-grade fever, chills and fatigue 1 year ago.  This began abruptly on 02/27/2019.  She recalls taking Tylenol and her symptoms resolved within 24 hours.  She did well until 04/03/2019 when she had a similar episode.  She felt feverish but did not take her temperature.  She broke a tooth and had a temporary crown placed in mid October.  4 days later on 07/03/2019 she had a third episode with a low-grade temperature of 100.5 degrees and chills that again  resolved within 24 hours.  She was treated for a possible dental infection on 08/24/2019.  She was given penicillin.  The following day she had a temperature of 100.5 degrees associated with upper arm pain.  She felt better the following day she did well until 02/07/2020 when she felt feverish and had a temperature of 99.5 degrees.  She had fatigue and upper arm soreness again that she thought might have been due to recent strength training.  Symptoms resolved within 36 hours.  She has been tested for Covid on multiple occasions and all have been negative.  She received her Covid vaccine in late April and May.  She did have a high fever of 102.5 degrees shortly after her second dose.  She is not on any new medications.  She says that she has gained weight over the last year.  Records from Dr. [name] indicate that she has undergone extensive laboratory testing and that the only remarkable finding was a  sedimentation rate of 40.  He mentioned that an ANA was negative.  She tells me that Dr. [name] had mentioned the possibility of doing an abdominal CT scan to look for any evidence of cancer and she has been worried about that.  When I first saw her on 02/15/2020 I did not find any clear evidence of active infection.  I was able to review Dr. Buel Ream records including blood work done over the past year.  Everything was normal except the slightly elevated sedimentation rate.  Katherine Todd brings her journal in today.  She has had 8 separate episodes over the last 2 months where she just did not feel right.  Oddly enough she says that each of these occurred on a weekend when she was not working.  She said she felt slightly warm.  The highest temperature she recorded was 99.8.  These are often associated with mild fatigue and scattered muscle and joint pains.  Each episode would resolve after taking acetaminophen.  Review of Systems: Review of Systems  Constitutional: Negative for chills, diaphoresis, fever and weight loss.       Subjective fevers.  Gastrointestinal: Positive for nausea. Negative for abdominal pain, diarrhea and vomiting.  Musculoskeletal: Positive for joint pain and myalgias.    Past Medical History:  Diagnosis Date  . Depression   . Fibrocystic breast   . Fibromyalgia    11/26/16 'resolved'  . Migraines   . Obesity   . Sleep apnea    not currently    Social History   Tobacco Use  . Smoking status: Never Smoker  . Smokeless tobacco: Never Used  Vaping Use  . Vaping Use: Never used  Substance Use Topics  . Alcohol use: No  . Drug use: No    Family History  Problem Relation Age of Onset  . Hyperlipidemia Mother   . Prostate cancer Father 42  . Thyroid disease Father   . Melanoma Brother 35       back of knee   . Colon cancer Maternal Grandfather 60       colon caner  . Stroke Maternal Grandmother   . Pancreatic cancer Paternal Grandmother           . Breast  cancer Neg Hx     Allergies  Allergen Reactions  . [employer] Diphedryl [Diphenhydramine] Other (See Comments)    Heart palpitations  Heart palpitations   . Atorvastatin Other (See Comments)    Lipitor= Muscle aches  . Adhesive [Tape] Rash  Objective: Vitals:   04/03/20 0857  BP: (!) 145/98  Pulse: 70  Temp: 98 F (36.7 C)  SpO2: 97%  Weight: 219 lb 9.6 oz (99.6 kg)   Body mass index is 37.11 kg/m.  Physical Exam Constitutional:      Comments: She is pleasant and in no distress.  Her weight is up 7 pounds in the past 6 months.  Cardiovascular:     Rate and Rhythm: Normal rate and regular rhythm.  Pulmonary:     Breath sounds: Normal breath sounds.  Musculoskeletal:        General: No swelling or tenderness.  Skin:    Findings: No rash.  Psychiatric:        Mood and Affect: Mood normal.     Lab Results    Problem List Items Addressed This Visit      High   Intermittent fever of unknown origin    Recent temperatures that she has recorded fall within a normal range.  I do not find any evidence to suggest that she actually has fever of unknown origin.  She seemed very reassured by this.  I do not feel any further testing or treatment for infection is indicated at this time.          Michel Bickers, MD [facility] for [facility]ious [place] [facility] [phone] pager   [phone] cell 04/03/2020, 9:22 AM

## 2020-05-08 ENCOUNTER — Ambulatory Visit: Payer: BC Managed Care – PPO

## 2020-05-14 ENCOUNTER — Other Ambulatory Visit: Payer: Self-pay

## 2020-05-14 ENCOUNTER — Ambulatory Visit
Admission: RE | Admit: 2020-05-14 | Discharge: 2020-05-14 | Disposition: A | Discharge status: Home / Self Care | Payer: BC Managed Care – PPO | Source: Ambulatory Visit | Attending: Obstetrics & Gynecology | Admitting: Obstetrics & Gynecology

## 2020-05-14 DIAGNOSIS — Z1231 Encounter for screening mammogram for malignant neoplasm of breast: Secondary | ICD-10-CM

## 2020-07-10 ENCOUNTER — Encounter: Payer: Self-pay | Admitting: Obstetrics & Gynecology

## 2020-07-10 ENCOUNTER — Telehealth: Payer: Self-pay

## 2020-07-10 NOTE — Telephone Encounter (Signed)
AEX 10/2019 PMP, on HRT  Spoke with pt. Pt states having right lower abd/pelvis pain after taking full class of pilates last night. Pt states was ok during class then went to bed and she was woke up with pain. Rates at 6 on scale. Pt states took 1 expired hydrocodone tablet and it helped her sleep and took pain away. Pt states today is a dull ache and rates as 1-2 on scale. Denies any vaginal bleeding, spotting, abd cramps, any vaginal sx or UTI sx.States has no previous hx of ovarian cyst. Denies fever, chills, NVD at this time. States next pilates class is Thursday and Sunday this week. Advised ok to do and if has pain to stop. Pt agreeable.    Pt advised can monitor for now and be seen over weekend if sx worsen or has fever, chills, severe abd pain, NVD. Pt agreeable and verbalized understanding. Thankful for advice. Pt declines OV at this time. Will call if sx continue.  Routing to Dr Sabra Heck for review Encounter closed

## 2020-07-10 NOTE — Telephone Encounter (Signed)
Pt sent following mychart message:  [name], [name] "Golden Circle"  P [facility] Hello Dr. Sabra Heck,  I recently started Pilates--did my first full class last night. Woke up in the middle of the night with pain in my Todd ovary. Should I get this checked?   I am off work Thursday and Friday of this week so those days would be more convenient than today.  Thank you,  Katherine Todd

## 2020-07-11 ENCOUNTER — Encounter: Payer: Self-pay | Admitting: Obstetrics & Gynecology

## 2020-07-19 ENCOUNTER — Encounter: Payer: Self-pay | Admitting: Gastroenterology

## 2021-01-30 ENCOUNTER — Ambulatory Visit: Payer: BC Managed Care – PPO

## 2021-03-11 NOTE — Progress Notes (Signed)
55 y.o. G0P0000 Single White or Caucasian female here for annual exam.  Doing well.  Exercising regularly now with pilates three times weekly.  Bariatric provider started her on Mounjaro for weight loss (new medication for prediabetes but also contributes to weight loss).    Denies vaginal bleeding.  No LMP recorded (lmp unknown). Patient is postmenopausal.          Sexually active: No.  The current method of family planning is post menopausal status.    Exercising: Yes.     Pilates three times weekly Smoker:  no  Health Maintenance: Pap:07/01/2018 Negative History of abnormal Pap:  no MMG: 05/14/2020 Negative Colonoscopy:07/10/2015 BMD: 05/22/2016 Osteopenia TDaP:  11/03/2019 Pneumonia vaccine(s):  not indicated Shingrix:   2019 Hep C testing: not done Screening Labs: I typically do not order her screening blood work   reports that she has never smoked. She has never used smokeless tobacco. She reports that she does not drink alcohol and does not use drugs.  Past Medical History:  Diagnosis Date   Depression    Fibrocystic breast    Fibromyalgia    11/26/16 'resolved'   Migraines    Obesity    Sleep apnea    not currently    Past Surgical History:  Procedure Laterality Date   GASTRIC BYPASS  04/2007, revision 2009 and 2011   "Mini" gastric bypass   HERNIA REPAIR  2009   incisional repair from gastsric bypass   LAPAROSCOPIC CHOLECYSTECTOMY  09/2002   Dr. March Rummage   TONSILLECTOMY AND ADENOIDECTOMY  age  8   WISDOM TOOTH EXTRACTION  age 42    Current Outpatient Medications  Medication Sig Dispense Refill   ARIPiprazole (ABILIFY) 5 MG tablet Take 2.5 mg by mouth daily.     BRINTELLIX 20 MG TABS Take 20 mg by mouth daily.     buPROPion (WELLBUTRIN XL) 300 MG 24 hr tablet Take 1 tablet by mouth daily.     Calcium 500-100 MG-UNIT CHEW Chew by mouth daily as needed.     cetirizine (ZYRTEC) 10 MG tablet Take 10 mg by mouth daily.     Cholecalciferol (VITAMIN D3) 400 UNIT/ML  LIQD Take by mouth daily.     clobetasol ointment (TEMOVATE) 7.02 % Apply 1 application topically at bedtime. Apply topically nightly 30 g 0   clonazePAM (KLONOPIN) 0.5 MG tablet Take 0.5 mg by mouth 2 (two) times daily as needed for anxiety.     cyanocobalamin 500 MCG tablet Take 500 mcg by mouth daily.     ferrous sulfate 325 (65 FE) MG tablet Take 1 tablet by mouth daily.     ipratropium (ATROVENT) 0.03 % nasal spray USE 2 SPRAYS IN EACH NOSTRIL TID AS NEEDED  2   lovastatin (MEVACOR) 20 MG tablet Take 20 mg by mouth once a week.     meloxicam (MOBIC) 15 MG tablet daily as needed.  6   Multiple Vitamin (MULTIVITAMIN) tablet Take 1 tablet by mouth 2 (two) times daily.      Multiple Vitamins-Minerals (OCUVITE EXTRA PO) Take 1 tablet by mouth daily.     Omega-3 Fatty Acids (FISH OIL) 1000 MG CAPS Take by mouth daily.     propranolol (INDERAL) 10 MG tablet Take 1 tablet by mouth daily as needed.  12   SUMAtriptan (IMITREX) 100 MG tablet TAKE 1 TABLET BY MOUTH ONCE AS NEEDED FOR MIGRAINE(MAY REPEAT IN 2 HORUS. MAX OF 2 TABLETS PER 24 HOURS)     tirzepatide (MOUNJARO) 2.5  MG/0.5ML Pen Inject 2.5 mg into the skin once a week.     Vitamin D, Ergocalciferol, (DRISDOL) 50000 UNITS CAPS Take 50,000 Units by mouth daily.      estradiol (ESTRACE) 0.1 MG/GM vaginal cream 1 gram vaginally twice weekly (Patient not taking: Reported on 03/12/2021) 42.5 g 3   famotidine (PEPCID) 20 MG tablet Take 20 mg by mouth 2 (two) times daily. (Patient not taking: Reported on 03/12/2021)     No current facility-administered medications for this visit.    Family History  Problem Relation Age of Onset   Hyperlipidemia Mother    Prostate cancer Father 54   Thyroid disease Father    Melanoma Brother 6       back of knee    Colon cancer Maternal Grandfather 38       colon caner   Stroke Maternal Grandmother    Pancreatic cancer Paternal Grandmother            Breast cancer Neg Hx     Review of Systems  All other  systems reviewed and are negative.  Exam:   BP 130/81 (BP Location: Right Arm, Patient Position: Sitting, Cuff Size: Large)   Pulse 72   Ht 5\' 4"  (1.626 m)   Wt 216 lb 6.4 oz (98.2 kg)   LMP  (LMP Unknown)   BMI 37.14 kg/m   Height: 5\' 4"  (162.6 cm)  General appearance: alert, cooperative and appears stated age Head: Normocephalic, without obvious abnormality, atraumatic Neck: no adenopathy, supple, symmetrical, trachea midline and thyroid normal to inspection and palpation Lungs: clear to auscultation bilaterally Breasts: normal appearance, no masses or tenderness Heart: regular rate and rhythm Abdomen: soft, non-tender; bowel sounds normal; no masses,  no organomegaly Extremities: extremities normal, atraumatic, no cyanosis or edema Skin: Skin color, texture, turgor normal. No rashes or lesions Lymph nodes: Cervical, supraclavicular, and axillary nodes normal. No abnormal inguinal nodes palpated Neurologic: Grossly normal   Pelvic: External genitalia:  no lesions              Urethra:  normal appearing urethra with no masses, tenderness or lesions              Bartholins and Skenes: normal                 Vagina: normal appearing vagina with normal color and no discharge, no lesions              Cervix: no lesions              Pap taken: Yes.   Bimanual Exam:  Uterus:  normal size, contour, position, consistency, mobility, non-tender              Adnexa: normal adnexa and no mass, fullness, tenderness               Rectovaginal: Confirms               Anus:  normal sphincter tone, no lesions  Chaperone, Octaviano Batty, CMA, was present for exam.  Assessment/Plan: 1. Well woman exam with routine gynecological exam - Pap and HR HPV obtained today - MMG 05/14/2020 - Colonoscopy 06/2015 - BMD 05/2016 - lab work done with Dr. [name] and bariatric provider - vaccines updated  2. Postmenopausal - no HRT  3. Cervical cancer screening - Cytology - PAP( [place])  4. H/O  bariatric surgery - h/o "mini" gastric bypass 8/08 with two revision in 2009 and 2011  5. Disorder  of skin of vulva - itchy skin with thickening that resolved with topical steroid use, normal today.  No biopsy was obtained.

## 2021-03-12 ENCOUNTER — Other Ambulatory Visit: Payer: Self-pay

## 2021-03-12 ENCOUNTER — Other Ambulatory Visit (HOSPITAL_COMMUNITY)
Admission: RE | Admit: 2021-03-12 | Discharge: 2021-03-12 | Disposition: A | Discharge status: Home / Self Care | Payer: BC Managed Care – PPO | Source: Ambulatory Visit | Attending: Obstetrics & Gynecology | Admitting: Obstetrics & Gynecology

## 2021-03-12 ENCOUNTER — Ambulatory Visit (INDEPENDENT_AMBULATORY_CARE_PROVIDER_SITE_OTHER): Payer: BC Managed Care – PPO | Admitting: Obstetrics & Gynecology

## 2021-03-12 ENCOUNTER — Encounter (HOSPITAL_BASED_OUTPATIENT_CLINIC_OR_DEPARTMENT_OTHER): Payer: Self-pay | Admitting: Obstetrics & Gynecology

## 2021-03-12 VITALS — BP 130/81 | HR 72 | Ht 64.0 in | Wt 216.4 lb

## 2021-03-12 DIAGNOSIS — Z780 Asymptomatic menopausal state: Secondary | ICD-10-CM | POA: Diagnosis not present

## 2021-03-12 DIAGNOSIS — Z9884 Bariatric surgery status: Secondary | ICD-10-CM | POA: Diagnosis not present

## 2021-03-12 DIAGNOSIS — Z01419 Encounter for gynecological examination (general) (routine) without abnormal findings: Secondary | ICD-10-CM

## 2021-03-12 DIAGNOSIS — Z124 Encounter for screening for malignant neoplasm of cervix: Secondary | ICD-10-CM | POA: Insufficient documentation

## 2021-03-12 DIAGNOSIS — L989 Disorder of the skin and subcutaneous tissue, unspecified: Secondary | ICD-10-CM

## 2021-03-14 LAB — CYTOLOGY - PAP
Comment: NEGATIVE
Diagnosis: NEGATIVE
High risk HPV: NEGATIVE

## 2021-03-31 ENCOUNTER — Other Ambulatory Visit: Payer: Self-pay | Admitting: Obstetrics & Gynecology

## 2021-03-31 DIAGNOSIS — Z1231 Encounter for screening mammogram for malignant neoplasm of breast: Secondary | ICD-10-CM

## 2021-04-22 ENCOUNTER — Encounter (HOSPITAL_BASED_OUTPATIENT_CLINIC_OR_DEPARTMENT_OTHER): Payer: Self-pay

## 2021-04-25 MED ORDER — FLUCONAZOLE 150 MG PO TABS: 150.0000 mg | ORAL_TABLET | Freq: Every day | ORAL | 0 refills | Status: AC

## 2021-05-26 ENCOUNTER — Ambulatory Visit: Payer: BC Managed Care – PPO

## 2021-05-28 ENCOUNTER — Ambulatory Visit
Admission: RE | Admit: 2021-05-28 | Discharge: 2021-05-28 | Disposition: A | Discharge status: Home / Self Care | Payer: BC Managed Care – PPO | Source: Ambulatory Visit | Attending: Obstetrics & Gynecology | Admitting: Obstetrics & Gynecology

## 2021-05-28 ENCOUNTER — Other Ambulatory Visit: Payer: Self-pay

## 2021-05-28 DIAGNOSIS — Z1231 Encounter for screening mammogram for malignant neoplasm of breast: Secondary | ICD-10-CM

## 2021-10-01 ENCOUNTER — Encounter (HOSPITAL_BASED_OUTPATIENT_CLINIC_OR_DEPARTMENT_OTHER): Payer: Self-pay | Admitting: Obstetrics & Gynecology

## 2021-12-18 ENCOUNTER — Other Ambulatory Visit (HOSPITAL_COMMUNITY): Payer: Self-pay

## 2021-12-18 MED ORDER — MOUNJARO 7.5 MG/0.5ML ~~LOC~~ SOAJ
7.5000 mg | SUBCUTANEOUS | 2 refills | Status: DC
  Filled 2021-12-18: qty 2, 28d supply, fill #0

## 2021-12-18 MED ORDER — MOUNJARO 10 MG/0.5ML ~~LOC~~ SOAJ
10.0000 mg | SUBCUTANEOUS | 2 refills | Status: DC
  Filled 2021-12-18: qty 2, 28d supply, fill #0
  Filled 2022-01-08: qty 2, 28d supply, fill #1

## 2022-01-08 ENCOUNTER — Other Ambulatory Visit (HOSPITAL_COMMUNITY): Payer: Self-pay

## 2022-02-27 ENCOUNTER — Encounter: Payer: Self-pay | Admitting: Cardiovascular Disease

## 2022-02-27 NOTE — Telephone Encounter (Signed)
error 

## 2022-03-02 ENCOUNTER — Other Ambulatory Visit: Payer: Self-pay | Admitting: *Deleted

## 2022-03-02 DIAGNOSIS — R002 Palpitations: Secondary | ICD-10-CM

## 2022-03-09 ENCOUNTER — Ambulatory Visit (INDEPENDENT_AMBULATORY_CARE_PROVIDER_SITE_OTHER): Payer: BC Managed Care – PPO

## 2022-03-09 DIAGNOSIS — R002 Palpitations: Secondary | ICD-10-CM | POA: Diagnosis not present

## 2022-03-11 ENCOUNTER — Ambulatory Visit (INDEPENDENT_AMBULATORY_CARE_PROVIDER_SITE_OTHER): Payer: BC Managed Care – PPO | Admitting: Internal Medicine

## 2022-03-11 ENCOUNTER — Encounter: Payer: Self-pay | Admitting: Internal Medicine

## 2022-03-11 VITALS — BP 110/82 | HR 73 | Ht 64.0 in | Wt 145.8 lb

## 2022-03-11 DIAGNOSIS — R002 Palpitations: Secondary | ICD-10-CM

## 2022-03-11 NOTE — Patient Instructions (Signed)
Medication Instructions:   *If you need a refill on your cardiac medications before your next appointment, please call your pharmacy*   Lab Work:  If you have labs (blood work) drawn today and your tests are completely normal, you will receive your results only by: MyChart Message (if you have MyChart) OR A paper copy in the mail If you have any lab test that is abnormal or we need to change your treatment, we will call you to review the results.   Testing/Procedures:    Follow-Up: At [facility], you and your health needs are our priority.  As part of our continuing mission to provide you with exceptional heart care, we have created designated Provider Care Teams.  These Care Teams include your primary Cardiologist (physician) and Advanced Practice Providers (APPs -  Physician Assistants and Nurse Practitioners) who all work together to provide you with the care you need, when you need it.  We recommend signing up for the patient portal called "MyChart".  Sign up information is provided on this After Visit Summary.  MyChart is used to connect with patients for Virtual Visits (Telemedicine).  Patients are able to view lab/test results, encounter notes, upcoming appointments, etc.  Non-urgent messages can be sent to your provider as well.   To learn more about what you can do with MyChart, go to [email or web address].     Important Information About Sugar       

## 2022-03-11 NOTE — Progress Notes (Signed)
Cardiology Office Note   Date:  03/11/2022   ID:  Katherine Todd, DOB Mar 12, 1966, MRN [id number]  PCP:  [name], MD  Cardiologist:   Dorris Carnes, MD   Patient referred for palpitations  bu Hart     History of Present Illness: Katherine Todd is a 56 y.o. female with a history of dyslipidemia in past, obesity (s/p gastric bypass) The pt says on the night of June 1/2 she was awakened with heart flutting    Lasted about 2 hours   no SOB  No dizzienss  Did not take pulse   Fell asleep   When woke up felt OK    She has occasional fleeting fluttering   Not like this spell     Otherwise feels great     Lost 75 lb with meds Doing pilates   Deniese CP   No SOB   No dizzienss  DIet   Admits to carbs, sugars     LIpids very high in past     Now on mevacor 1x per week      Current Meds  Medication Sig   ARIPiprazole (ABILIFY) 5 MG tablet Take 2.5 mg by mouth daily.   BRINTELLIX 20 MG TABS Take 20 mg by mouth daily.   buPROPion (WELLBUTRIN XL) 300 MG 24 hr tablet Take 1 tablet by mouth daily.   Calcium 500-100 MG-UNIT CHEW Chew by mouth daily as needed.   cetirizine (ZYRTEC) 10 MG tablet Take 10 mg by mouth daily.   Cholecalciferol (VITAMIN D3) 400 UNIT/ML LIQD Take by mouth daily.   clonazePAM (KLONOPIN) 0.5 MG tablet Take 0.5 mg by mouth 2 (two) times daily as needed for anxiety.   cyanocobalamin 500 MCG tablet Take 500 mcg by mouth daily.   famotidine (PEPCID) 20 MG tablet Take 20 mg by mouth 2 (two) times daily.   ipratropium (ATROVENT) 0.03 % nasal spray USE 2 SPRAYS IN EACH NOSTRIL TID AS NEEDED   levothyroxine (SYNTHROID) 50 MCG tablet Take 50 mcg by mouth every morning.   lovastatin (MEVACOR) 20 MG tablet Take 20 mg by mouth once a week.   meloxicam (MOBIC) 15 MG tablet daily as needed.   Multiple Vitamin (MULTIVITAMIN) tablet Take 1 tablet by mouth 2 (two) times daily.    Multiple Vitamins-Minerals (OCUVITE EXTRA PO) Take 1 tablet by mouth daily.    Omega-3 Fatty Acids (FISH OIL) 1000 MG CAPS Take by mouth daily.   propranolol (INDERAL) 10 MG tablet Take 1 tablet by mouth daily as needed.   SUMAtriptan (IMITREX) 100 MG tablet TAKE 1 TABLET BY MOUTH ONCE AS NEEDED FOR MIGRAINE(MAY REPEAT IN 2 HORUS. MAX OF 2 TABLETS PER 24 HOURS)   triamterene-hydrochlorothiazide (MAXZIDE-25) 37.5-25 MG tablet Take 0.5 tablets by mouth every morning.   Vitamin D, Ergocalciferol, (DRISDOL) 50000 UNITS CAPS Take 50,000 Units by mouth daily.      Allergies:   Albertsons diphedryl [diphenhydramine], Atorvastatin, and Adhesive [tape]   Past Medical History:  Diagnosis Date   Depression    Fibrocystic breast    Fibromyalgia    11/26/16 'resolved'   Migraines    Obesity    Sleep apnea    not currently    Past Surgical History:  Procedure Laterality Date   GASTRIC BYPASS  04/2007, revision 2009 and 2011   "Mini" gastric bypass   HERNIA REPAIR  2009   incisional repair from gastsric bypass   LAPAROSCOPIC CHOLECYSTECTOMY  09/2002   Dr. March Rummage  TONSILLECTOMY AND ADENOIDECTOMY  age  14   WISDOM TOOTH EXTRACTION  age 6     Social History:  The patient  reports that she has never smoked. She has never used smokeless tobacco. She reports that she does not drink alcohol and does not use drugs.   Family History:  The patient's family history includes Colon cancer (age of onset: 59) in her maternal grandfather; Hyperlipidemia in her mother; Melanoma (age of onset: 39) in her brother; Pancreatic cancer in her paternal grandmother; Prostate cancer (age of onset: 92) in her father; Stroke in her maternal grandmother; Thyroid disease in her father.    ROS:  Please see the history of present illness. All other systems are reviewed and  Negative to the above problem except as noted.    PHYSICAL EXAM: VS:  BP 110/82   Pulse 73   Ht '5\' 4"'$  (1.626 m)   Wt 145 lb 12.8 oz (66.1 kg)   LMP 09/14/2010   SpO2 99%   BMI 25.03 kg/m   GEN: Well nourished, well  developed, in no acute distress  HEENT: normal  Neck: no JVD, carotid bruits Cardiac: RRR; no murmurs  No  LE edema  Respiratory:  clear to auscultation bilaterally GI: soft, nontender, nondistended, + BS  No hepatomegaly  MS: no deformity Moving all extremities   Skin: warm and dry, no rash Neuro:  Strength and sensation are intact Psych: euthymic mood, full affect   EKG:  EKG is ordered today.  SR 73 bpm     Lipid Panel No results found for: "CHOL", "TRIG", "HDL", "CHOLHDL", "VLDL", "LDLCALC", "LDLDIRECT"    Wt Readings from Last 3 Encounters:  03/11/22 145 lb 12.8 oz (66.1 kg)  03/12/21 216 lb 6.4 oz (98.2 kg)  04/03/20 219 lb 9.6 oz (99.6 kg)      ASSESSMENT AND PLAN:  1  Palpitations    One spell  Suspicious for afib     No dizzienss    I would continue to use monitor   If no recurrence in 1 month then I would get a Kardia Mobile      Need rhythm WIll be available as needed   2  Lipids   Last panel in Jan 2023 Very good   Not sure what mevacor is doing  1x per week   No goal  No known CAD  3  Diet   Cut back on carbs   References for Zoe, videos given  Follow up PRN for palpitations     She uses my chart   Current medicines are reviewed at length with the patient today.  The patient does not have concerns regarding medicines.  Signed, Dorris Carnes, MD  03/11/2022 1:47 PM    [facility] [address], [place], [place]  [address] Phone: [phone]; Fax: [phone]

## 2022-03-24 ENCOUNTER — Other Ambulatory Visit (HOSPITAL_COMMUNITY): Payer: Self-pay

## 2022-03-24 MED ORDER — HYDROCODONE BIT-HOMATROP MBR 5-1.5 MG/5ML PO SOLN
5.0000 mL | Freq: Four times a day (QID) | ORAL | 0 refills | Status: DC | PRN
  Filled 2022-03-24: qty 140, 7d supply, fill #0

## 2022-04-08 ENCOUNTER — Other Ambulatory Visit (HOSPITAL_BASED_OUTPATIENT_CLINIC_OR_DEPARTMENT_OTHER): Payer: Self-pay

## 2022-04-08 MED ORDER — MOUNJARO 5 MG/0.5ML ~~LOC~~ SOAJ
SUBCUTANEOUS | 11 refills | Status: DC
  Filled 2022-04-08: qty 2, 28d supply, fill #0
  Filled 2022-04-24: qty 2, 28d supply, fill #1
  Filled 2022-04-30 (×2): qty 2, 28d supply, fill #0
  Filled 2022-04-30 – 2022-05-28 (×2): qty 2, 28d supply, fill #1
  Filled 2022-06-18: qty 2, 28d supply, fill #2
  Filled ????-??-??: fill #1

## 2022-04-10 ENCOUNTER — Ambulatory Visit (INDEPENDENT_AMBULATORY_CARE_PROVIDER_SITE_OTHER): Payer: BC Managed Care – PPO | Admitting: Obstetrics & Gynecology

## 2022-04-10 ENCOUNTER — Encounter (HOSPITAL_BASED_OUTPATIENT_CLINIC_OR_DEPARTMENT_OTHER): Payer: Self-pay | Admitting: Obstetrics & Gynecology

## 2022-04-10 VITALS — BP 125/84 | HR 77 | Ht 64.0 in | Wt 143.6 lb

## 2022-04-10 DIAGNOSIS — Z01419 Encounter for gynecological examination (general) (routine) without abnormal findings: Secondary | ICD-10-CM | POA: Diagnosis not present

## 2022-04-10 DIAGNOSIS — Z9884 Bariatric surgery status: Secondary | ICD-10-CM | POA: Diagnosis not present

## 2022-04-10 DIAGNOSIS — Z780 Asymptomatic menopausal state: Secondary | ICD-10-CM | POA: Diagnosis not present

## 2022-04-10 NOTE — Progress Notes (Signed)
56 y.o. G0P0000 Single White or Caucasian female here for annual exam.  Has been having some palpitations and has just finished wearing a heart monitor.  Has not gotten the results yet.  Also, having some anemia.  Has done some additional blood work.  Done with Dr. [name].  Hemoglobin was down 2 pounds.  She has hemocult cards to do this weekend and will take them on Monday.  Last colonoscopy was 2016 and was done by Dr. Havery Moros.  Colonoscopy is due.  H/o bariatric surgery.    Patient's last menstrual period was 09/14/2010.          Sexually active: No.  The current method of family planning is post menopausal status.    Exercising: Yes.    Pilates 4 times a week Smoker:  no  Health Maintenance: Pap:  03/12/2021 Negative History of abnormal Pap:  no MMG:  05/28/2021 Negative Colonoscopy:  07/10/2015 BMD:   05/22/2016 Screening Labs: lab work is done with Dr. [name]   reports that she has never smoked. She has never used smokeless tobacco. She reports that she does not drink alcohol and does not use drugs.  Past Medical History:  Diagnosis Date   Depression    Fibrocystic breast    Fibromyalgia    11/26/16 'resolved'   Migraines    Obesity    Sleep apnea    not currently    Past Surgical History:  Procedure Laterality Date   GASTRIC BYPASS  04/2007, revision 2009 and 2011   "Mini" gastric bypass   HERNIA REPAIR  2009   incisional repair from gastsric bypass   LAPAROSCOPIC CHOLECYSTECTOMY  09/2002   Dr. March Rummage   TONSILLECTOMY AND ADENOIDECTOMY  age  31   WISDOM TOOTH EXTRACTION  age 56    Current Outpatient Medications  Medication Sig Dispense Refill   ARIPiprazole (ABILIFY) 5 MG tablet Take 2.5 mg by mouth daily.     BRINTELLIX 20 MG TABS Take 20 mg by mouth daily.     buPROPion (WELLBUTRIN XL) 300 MG 24 hr tablet Take 1 tablet by mouth daily.     Calcium 500-100 MG-UNIT CHEW Chew by mouth daily as needed.     cetirizine (ZYRTEC) 10 MG tablet Take 10 mg by mouth  daily.     Cholecalciferol (VITAMIN D3) 400 UNIT/ML LIQD Take by mouth daily.     clonazePAM (KLONOPIN) 0.5 MG tablet Take 0.5 mg by mouth 2 (two) times daily as needed for anxiety.     cyanocobalamin 500 MCG tablet Take 500 mcg by mouth daily.     famotidine (PEPCID) 20 MG tablet Take 20 mg by mouth 2 (two) times daily.     ipratropium (ATROVENT) 0.03 % nasal spray USE 2 SPRAYS IN EACH NOSTRIL TID AS NEEDED  2   levothyroxine (SYNTHROID) 50 MCG tablet Take 50 mcg by mouth every morning.     lovastatin (MEVACOR) 20 MG tablet Take 20 mg by mouth once a week.     meloxicam (MOBIC) 15 MG tablet daily as needed.  6   Multiple Vitamin (MULTIVITAMIN) tablet Take 1 tablet by mouth 2 (two) times daily.      Multiple Vitamins-Minerals (OCUVITE EXTRA PO) Take 1 tablet by mouth daily.     Omega-3 Fatty Acids (FISH OIL) 1000 MG CAPS Take by mouth daily.     propranolol (INDERAL) 10 MG tablet Take 1 tablet by mouth daily as needed.  12   SUMAtriptan (IMITREX) 100 MG tablet TAKE 1 TABLET  BY MOUTH ONCE AS NEEDED FOR MIGRAINE(MAY REPEAT IN 2 HORUS. MAX OF 2 TABLETS PER 24 HOURS)     tirzepatide (MOUNJARO) 5 MG/0.5ML Pen inject '5mg'$  Subcutaneous weekly 30 days 2 mL 11   triamterene-hydrochlorothiazide (MAXZIDE-25) 37.5-25 MG tablet Take 0.5 tablets by mouth every morning.     Vitamin D, Ergocalciferol, (DRISDOL) 50000 UNITS CAPS Take 50,000 Units by mouth daily.      No current facility-administered medications for this visit.    Family History  Problem Relation Age of Onset   Hyperlipidemia Mother    Prostate cancer Father 15   Thyroid disease Father    Melanoma Brother 66       back of knee    Colon cancer Maternal Grandfather 14       colon caner   Stroke Maternal Grandmother    Pancreatic cancer Paternal Grandmother            Breast cancer Neg Hx     ROS: Constitutional: negative Genitourinary:negative  Exam:   BP 125/84 (BP Location: Left Arm, Patient Position: Sitting, Cuff Size: Large)    Pulse 77   Ht '5\' 4"'$  (1.626 m) Comment: Reported  Wt 143 lb 9.6 oz (65.1 kg)   LMP 09/14/2010   BMI 24.65 kg/m   Height: '5\' 4"'$  (162.6 cm) (Reported)  General appearance: alert, cooperative and appears stated age Head: Normocephalic, without obvious abnormality, atraumatic Neck: no adenopathy, supple, symmetrical, trachea midline and thyroid normal to inspection and palpation Lungs: clear to auscultation bilaterally Breasts: normal appearance, no masses or tenderness Heart: regular rate and rhythm Abdomen: soft, non-tender; bowel sounds normal; no masses,  no organomegaly Extremities: extremities normal, atraumatic, no cyanosis or edema Skin: Skin color, texture, turgor normal. No rashes or lesions Lymph nodes: Cervical, supraclavicular, and axillary nodes normal. No abnormal inguinal nodes palpated Neurologic: Grossly normal   Pelvic: External genitalia:  no lesions              Urethra:  normal appearing urethra with no masses, tenderness or lesions              Bartholins and Skenes: normal                 Vagina: normal appearing vagina with normal color and no discharge, no lesions              Cervix: no lesions              Pap taken: No. Bimanual Exam:  Uterus:  normal size, contour, position, consistency, mobility, non-tender              Adnexa: normal adnexa and no mass, fullness, tenderness               Rectovaginal: Confirms               Anus:  normal sphincter tone, no lesions  Chaperone, Octaviano Batty, CMA, was present for exam.  Assessment/Plan: 1. Well woman exam with routine gynecological exam - Pap smear 2022 - Mammogram 05/2021 - Colonoscopy 2016.  Pt aware needs this year. - Bone mineral density last copy I have was 2017.  She will check with Dr. [name] next week about this. - lab work done done with PCP - vaccines reviewed/updated   2. Postmenopausal - no HRT  3. H/O bariatric surgery - had "mini" gastric bypass 8/08 with two revision in 2009  and 2011.

## 2022-04-14 ENCOUNTER — Other Ambulatory Visit: Payer: Self-pay | Admitting: Obstetrics & Gynecology

## 2022-04-14 DIAGNOSIS — Z1231 Encounter for screening mammogram for malignant neoplasm of breast: Secondary | ICD-10-CM

## 2022-04-15 ENCOUNTER — Encounter (HOSPITAL_BASED_OUTPATIENT_CLINIC_OR_DEPARTMENT_OTHER): Payer: Self-pay | Admitting: Obstetrics & Gynecology

## 2022-04-24 ENCOUNTER — Other Ambulatory Visit (HOSPITAL_BASED_OUTPATIENT_CLINIC_OR_DEPARTMENT_OTHER): Payer: Self-pay

## 2022-04-27 ENCOUNTER — Other Ambulatory Visit (HOSPITAL_BASED_OUTPATIENT_CLINIC_OR_DEPARTMENT_OTHER): Payer: Self-pay

## 2022-04-28 ENCOUNTER — Other Ambulatory Visit (HOSPITAL_BASED_OUTPATIENT_CLINIC_OR_DEPARTMENT_OTHER): Payer: Self-pay

## 2022-04-30 ENCOUNTER — Other Ambulatory Visit (HOSPITAL_COMMUNITY): Payer: Self-pay

## 2022-04-30 ENCOUNTER — Other Ambulatory Visit (HOSPITAL_BASED_OUTPATIENT_CLINIC_OR_DEPARTMENT_OTHER): Payer: Self-pay

## 2022-05-02 ENCOUNTER — Other Ambulatory Visit (HOSPITAL_COMMUNITY): Payer: Self-pay

## 2022-05-14 ENCOUNTER — Encounter (HOSPITAL_BASED_OUTPATIENT_CLINIC_OR_DEPARTMENT_OTHER): Payer: Self-pay | Admitting: *Deleted

## 2022-05-21 ENCOUNTER — Other Ambulatory Visit (HOSPITAL_COMMUNITY): Payer: Self-pay | Admitting: *Deleted

## 2022-05-22 ENCOUNTER — Ambulatory Visit (HOSPITAL_COMMUNITY)
Admission: RE | Admit: 2022-05-22 | Discharge: 2022-05-22 | Disposition: A | Discharge status: Home / Self Care | Payer: BC Managed Care – PPO | Source: Ambulatory Visit | Attending: Internal Medicine | Admitting: Internal Medicine

## 2022-05-22 DIAGNOSIS — M810 Age-related osteoporosis without current pathological fracture: Secondary | ICD-10-CM | POA: Insufficient documentation

## 2022-05-22 MED ORDER — DENOSUMAB 60 MG/ML ~~LOC~~ SOSY
60.0000 mg | PREFILLED_SYRINGE | Freq: Once | SUBCUTANEOUS | Status: AC
  Administered 2022-05-22: 60 mg via SUBCUTANEOUS

## 2022-05-22 MED ORDER — DENOSUMAB 60 MG/ML ~~LOC~~ SOSY
PREFILLED_SYRINGE | SUBCUTANEOUS | Status: AC
  Filled 2022-05-22: qty 1

## 2022-05-28 ENCOUNTER — Other Ambulatory Visit (HOSPITAL_COMMUNITY): Payer: Self-pay

## 2022-06-01 ENCOUNTER — Other Ambulatory Visit (HOSPITAL_COMMUNITY): Payer: Self-pay

## 2022-06-01 MED ORDER — MOUNJARO 2.5 MG/0.5ML ~~LOC~~ SOAJ
2.5000 mg | SUBCUTANEOUS | 5 refills | Status: DC
  Filled 2022-06-01 – 2022-07-16 (×3): qty 2, 28d supply, fill #0
  Filled 2022-08-15: qty 2, 28d supply, fill #1
  Filled 2023-01-12: qty 2, 28d supply, fill #2

## 2022-06-02 ENCOUNTER — Ambulatory Visit
Admission: RE | Admit: 2022-06-02 | Discharge: 2022-06-02 | Disposition: A | Discharge status: Home / Self Care | Payer: BC Managed Care – PPO | Source: Ambulatory Visit | Attending: Obstetrics & Gynecology | Admitting: Obstetrics & Gynecology

## 2022-06-02 DIAGNOSIS — Z1231 Encounter for screening mammogram for malignant neoplasm of breast: Secondary | ICD-10-CM

## 2022-06-15 ENCOUNTER — Encounter: Payer: Self-pay | Admitting: Gastroenterology

## 2022-06-18 ENCOUNTER — Other Ambulatory Visit (HOSPITAL_COMMUNITY): Payer: Self-pay

## 2022-06-20 ENCOUNTER — Other Ambulatory Visit (HOSPITAL_COMMUNITY): Payer: Self-pay

## 2022-06-26 ENCOUNTER — Ambulatory Visit (AMBULATORY_SURGERY_CENTER): Payer: Self-pay

## 2022-06-26 ENCOUNTER — Encounter: Payer: Self-pay | Admitting: Gastroenterology

## 2022-06-26 ENCOUNTER — Other Ambulatory Visit: Payer: Self-pay

## 2022-06-26 VITALS — Ht 64.0 in | Wt 140.6 lb

## 2022-06-26 DIAGNOSIS — Z86010 Personal history of colonic polyps: Secondary | ICD-10-CM

## 2022-06-26 MED ORDER — NA SULFATE-K SULFATE-MG SULF 17.5-3.13-1.6 GM/177ML PO SOLN: 1.0000 | Freq: Once | ORAL | 0 refills | Status: AC

## 2022-06-26 NOTE — Progress Notes (Signed)
Denies allergies to eggs or soy products. Denies complication of anesthesia or sedation. Denies use of weight loss medication. Denies use of O2.   Emmi instructions given for colonoscopy.  

## 2022-06-28 ENCOUNTER — Encounter: Payer: Self-pay | Admitting: Gastroenterology

## 2022-07-16 ENCOUNTER — Other Ambulatory Visit (HOSPITAL_COMMUNITY): Payer: Self-pay

## 2022-07-18 ENCOUNTER — Other Ambulatory Visit (HOSPITAL_COMMUNITY): Payer: Self-pay

## 2022-07-22 ENCOUNTER — Encounter: Payer: BC Managed Care – PPO | Admitting: Gastroenterology

## 2022-07-25 ENCOUNTER — Encounter: Payer: Self-pay | Admitting: Certified Registered Nurse Anesthetist

## 2022-07-27 ENCOUNTER — Encounter: Payer: Self-pay | Admitting: Gastroenterology

## 2022-07-31 ENCOUNTER — Ambulatory Visit (AMBULATORY_SURGERY_CENTER): Payer: BC Managed Care – PPO | Admitting: Gastroenterology

## 2022-07-31 ENCOUNTER — Encounter: Payer: Self-pay | Admitting: Gastroenterology

## 2022-07-31 VITALS — BP 107/71 | HR 73 | Temp 97.5°F | Resp 10 | Ht 64.0 in | Wt 140.6 lb

## 2022-07-31 DIAGNOSIS — Z86010 Personal history of colonic polyps: Secondary | ICD-10-CM | POA: Diagnosis not present

## 2022-07-31 DIAGNOSIS — D123 Benign neoplasm of transverse colon: Secondary | ICD-10-CM

## 2022-07-31 DIAGNOSIS — D122 Benign neoplasm of ascending colon: Secondary | ICD-10-CM

## 2022-07-31 DIAGNOSIS — K5140 Inflammatory polyps of colon without complications: Secondary | ICD-10-CM

## 2022-07-31 DIAGNOSIS — Z09 Encounter for follow-up examination after completed treatment for conditions other than malignant neoplasm: Secondary | ICD-10-CM | POA: Diagnosis present

## 2022-07-31 MED ORDER — SODIUM CHLORIDE 0.9 % IV SOLN: 500.0000 mL | Freq: Once | INTRAVENOUS | Status: DC

## 2022-07-31 NOTE — Patient Instructions (Signed)
Handouts on polyps and hemorrhoids given to you today  Await pathology results   YOU HAD AN ENDOSCOPIC PROCEDURE TODAY AT THE [facility]:   Refer to the procedure report that was given to you for any specific questions about what was found during the examination.  If the procedure report does not answer your questions, please call your gastroenterologist to clarify.  If you requested that your care partner not be given the details of your procedure findings, then the procedure report has been included in a sealed envelope for you to review at your convenience later.  YOU SHOULD EXPECT: Some feelings of bloating in the abdomen. Passage of more gas than usual.  Walking can help get rid of the air that was put into your GI tract during the procedure and reduce the bloating. If you had a lower endoscopy (such as a colonoscopy or flexible sigmoidoscopy) you may notice spotting of blood in your stool or on the toilet paper. If you underwent a bowel prep for your procedure, you may not have a normal bowel movement for a few days.  Please Note:  You might notice some irritation and congestion in your nose or some drainage.  This is from the oxygen used during your procedure.  There is no need for concern and it should clear up in a day or so.  SYMPTOMS TO REPORT IMMEDIATELY:  Following lower endoscopy (colonoscopy or flexible sigmoidoscopy):  Excessive amounts of blood in the stool  Significant tenderness or worsening of abdominal pains  Swelling of the abdomen that is new, acute  Fever of 100F or higher  For urgent or emergent issues, a gastroenterologist can be reached at any hour by calling [phone]. Do not use MyChart messaging for urgent concerns.    DIET:  We do recommend a small meal at first, but then you may proceed to your regular diet.  Drink plenty of fluids but you should avoid alcoholic beverages for 24 hours.  ACTIVITY:  You should plan to take it easy for the rest  of today and you should NOT DRIVE or use heavy machinery until tomorrow (because of the sedation medicines used during the test).    FOLLOW UP: Our staff will call the number listed on your records the next business day following your procedure.  We will call around 7:15- 8:00 am to check on you and address any questions or concerns that you may have regarding the information given to you following your procedure. If we do not reach you, we will leave a message.     If any biopsies were taken you will be contacted by phone or by letter within the next 1-3 weeks.  Please call us at [phone] if you have not heard about the biopsies in 3 weeks.    SIGNATURES/CONFIDENTIALITY: You and/or your care partner have signed paperwork which will be entered into your electronic medical record.  These signatures attest to the fact that that the information above on your After Visit Summary has been reviewed and is understood.  Full responsibility of the confidentiality of this discharge information lies with you and/or your care-partner. 

## 2022-07-31 NOTE — Progress Notes (Signed)
[facility] History and Physical   Primary Care Physician:  [name], MD   Reason for Procedure:   History of colon polyps  Plan:    colonoscopy     HPI: Katherine Todd is a 56 y.o. female  here for colonoscopy surveillance - last exam 06/2015 with a few polyps removed, at least one TA.   Patient denies any bowel symptoms at this time. No first degree family history of colon cancer known. Otherwise feels well without any cardiopulmonary symptoms.   I have discussed risks / benefits of anesthesia and endoscopic procedure with Rosalio Loud and they wish to proceed with the exams as outlined today.    Past Medical History:  Diagnosis Date   Allergy    Anemia    Anxiety    Arthritis    Cataract    Depression    Fibrocystic breast    Fibromyalgia    11/26/16 'resolved'   GERD (gastroesophageal reflux disease)    Hyperlipidemia    Migraines    Obesity    Osteoporosis    Sleep apnea    not currently   Thyroid disease     Past Surgical History:  Procedure Laterality Date   GASTRIC BYPASS  04/2007, revision 2009 and 2011   "Mini" gastric bypass   HERNIA REPAIR  2009   incisional repair from gastsric bypass   LAPAROSCOPIC CHOLECYSTECTOMY  09/2002   Dr. March Rummage   TONSILLECTOMY AND ADENOIDECTOMY  age  18   WISDOM TOOTH EXTRACTION  age 8    Prior to Admission medications   Medication Sig Start Date End Date Taking? Authorizing Provider  ARIPiprazole (ABILIFY) 5 MG tablet Take 2.5 mg by mouth daily.   Yes [provider]  BRINTELLIX 20 MG TABS Take 20 mg by mouth daily. 03/12/14  Yes [provider]  buPROPion (WELLBUTRIN XL) 300 MG 24 hr tablet Take 1 tablet by mouth daily. 03/18/14  Yes [provider]  Calcium 500-100 MG-UNIT CHEW Chew by mouth daily as needed.   Yes [provider]  cetirizine (ZYRTEC) 10 MG tablet Take 10 mg by mouth daily.   Yes [provider]  Cholecalciferol (VITAMIN D3) 400  UNIT/ML LIQD Take by mouth daily. 03/02/16  Yes [provider]  cyanocobalamin 500 MCG tablet Take 500 mcg by mouth daily.   Yes [provider]  famotidine (PEPCID) 20 MG tablet Take 20 mg by mouth 2 (two) times daily. 09/25/19  Yes [provider]  ipratropium (ATROVENT) 0.03 % nasal spray USE 2 SPRAYS IN EACH NOSTRIL TID AS NEEDED 01/16/15  Yes [provider]  levothyroxine (SYNTHROID) 50 MCG tablet Take 50 mcg by mouth every morning. 02/19/22  Yes [provider]  lovastatin (MEVACOR) 20 MG tablet Take 20 mg by mouth once a week.   Yes [provider]  Multiple Vitamin (MULTIVITAMIN) tablet Take 1 tablet by mouth 2 (two) times daily.    Yes [provider]  Multiple Vitamins-Minerals (OCUVITE EXTRA PO) Take 1 tablet by mouth daily.   Yes [provider]  Omega-3 Fatty Acids (FISH OIL) 1000 MG CAPS Take by mouth daily.   Yes [provider]  triamterene-hydrochlorothiazide (MAXZIDE-25) 37.5-25 MG tablet Take 0.5 tablets by mouth every morning. 03/09/22  Yes [provider]  Vitamin D, Ergocalciferol, (DRISDOL) 50000 UNITS CAPS Take 50,000 Units by mouth daily.    Yes [provider]  clonazePAM (KLONOPIN) 0.5 MG tablet Take 0.5 mg by mouth  2 (two) times daily as needed for anxiety.    [provider]  denosumab (PROLIA) 60 MG/ML SOSY injection Inject 60 mg into the skin every 6 (six) months.    [provider]  meloxicam (MOBIC) 15 MG tablet daily as needed. Patient not taking: Reported on 07/31/2022 04/20/18   [provider]  propranolol (INDERAL) 10 MG tablet Take 1 tablet by mouth daily as needed. 04/17/18   [provider]  SUMAtriptan (IMITREX) 100 MG tablet TAKE 1 TABLET BY MOUTH ONCE AS NEEDED FOR MIGRAINE(MAY REPEAT IN 2 HORUS. MAX OF 2 TABLETS PER 24 HOURS) 01/07/15   [provider]  tirzepatide [facility]) 2.5 MG/0.5ML Pen Inject 2.5 mg into the skin  once a week. 06/01/22       Current Outpatient Medications  Medication Sig Dispense Refill   ARIPiprazole (ABILIFY) 5 MG tablet Take 2.5 mg by mouth daily.     BRINTELLIX 20 MG TABS Take 20 mg by mouth daily.     buPROPion (WELLBUTRIN XL) 300 MG 24 hr tablet Take 1 tablet by mouth daily.     Calcium 500-100 MG-UNIT CHEW Chew by mouth daily as needed.     cetirizine (ZYRTEC) 10 MG tablet Take 10 mg by mouth daily.     Cholecalciferol (VITAMIN D3) 400 UNIT/ML LIQD Take by mouth daily.     cyanocobalamin 500 MCG tablet Take 500 mcg by mouth daily.     famotidine (PEPCID) 20 MG tablet Take 20 mg by mouth 2 (two) times daily.     ipratropium (ATROVENT) 0.03 % nasal spray USE 2 SPRAYS IN EACH NOSTRIL TID AS NEEDED  2   levothyroxine (SYNTHROID) 50 MCG tablet Take 50 mcg by mouth every morning.     lovastatin (MEVACOR) 20 MG tablet Take 20 mg by mouth once a week.     Multiple Vitamin (MULTIVITAMIN) tablet Take 1 tablet by mouth 2 (two) times daily.      Multiple Vitamins-Minerals (OCUVITE EXTRA PO) Take 1 tablet by mouth daily.     Omega-3 Fatty Acids (FISH OIL) 1000 MG CAPS Take by mouth daily.     triamterene-hydrochlorothiazide (MAXZIDE-25) 37.5-25 MG tablet Take 0.5 tablets by mouth every morning.     Vitamin D, Ergocalciferol, (DRISDOL) 50000 UNITS CAPS Take 50,000 Units by mouth daily.      clonazePAM (KLONOPIN) 0.5 MG tablet Take 0.5 mg by mouth 2 (two) times daily as needed for anxiety.     denosumab (PROLIA) 60 MG/ML SOSY injection Inject 60 mg into the skin every 6 (six) months.     meloxicam (MOBIC) 15 MG tablet daily as needed. (Patient not taking: Reported on 07/31/2022)  6   propranolol (INDERAL) 10 MG tablet Take 1 tablet by mouth daily as needed.  12   SUMAtriptan (IMITREX) 100 MG tablet TAKE 1 TABLET BY MOUTH ONCE AS NEEDED FOR MIGRAINE(MAY REPEAT IN 2 HORUS. MAX OF 2 TABLETS PER 24 HOURS)     tirzepatide (MOUNJARO) 2.5 MG/0.5ML Pen Inject 2.5 mg into the skin once a week. 2 mL 5    Current Facility-Administered Medications  Medication Dose Route Frequency Provider Last Rate Last Admin   0.9 %  sodium chloride infusion  500 mL Intravenous Once Daymion Nazaire, Carlota Raspberry, MD        Allergies as of 07/31/2022 - Review Complete 07/31/2022  Allergen Reaction Noted   Albertsons diphedryl [diphenhydramine] Other (See Comments) 04/02/2015   Atorvastatin Other (See Comments) 04/02/2015   Adhesive [tape] Rash 02/21/2013  Family History  Problem Relation Age of Onset   Hyperlipidemia Mother    Prostate cancer Father 18   Thyroid disease Father    Melanoma Brother 66       back of knee    Stroke Maternal Grandmother    Colon cancer Maternal Grandfather 59       colon caner   Pancreatic cancer Paternal Grandmother            Breast cancer Neg Hx    Esophageal cancer Neg Hx    Stomach cancer Neg Hx    Rectal cancer Neg Hx     Social History   Socioeconomic History   Marital status: Single    Spouse name: Not on file   Number of children: 0   Years of education: 20   Highest education level: Not on file  Occupational History    Comment: works for [employer]  Tobacco Use   Smoking status: Never   Smokeless tobacco: Never  Vaping Use   Vaping Use: Never used  Substance and Sexual Activity   Alcohol use: No   Drug use: No   Sexual activity: Not Currently    Birth control/protection: Post-menopausal  Other Topics Concern   Not on file  Social History Narrative   Lives alone   Caffeine- coffee, 2-3 cups daily, soda 1 daily   Social Determinants of Health   Financial Resource Strain: Not on file  Food Insecurity: Not on file  Transportation Needs: Not on file  Physical Activity: Not on file  Stress: Not on file  Social Connections: Not on file  Intimate Partner Violence: Not on file    Review of Systems: All other review of systems negative except as mentioned in the HPI.  Physical Exam: Vital signs BP 121/75   Pulse 91   Temp (!) 97.5 F  (36.4 C) (Skin)   Ht '5\' 4"'$  (1.626 m)   Wt 140 lb 9.6 oz (63.8 kg)   LMP 09/14/2010   SpO2 99%   BMI 24.13 kg/m   General:   Alert,  Well-developed, pleasant and cooperative in NAD Lungs:  Clear throughout to auscultation.   Heart:  Regular rate and rhythm Abdomen:  Soft, nontender and nondistended.   Neuro/Psych:  Alert and cooperative. Normal mood and affect. A and O x 3  Jolly Mango, MD [facility]

## 2022-07-31 NOTE — Op Note (Signed)
[name]: Katherine Todd Procedure Date: 07/31/2022 8:36 AM MRN: [id number] Endoscopist: Remo Lipps P. Havery Moros , MD, [id number] Age: 56 Referring MD:  Date of Birth: 12/29/1965 Gender: Female Account #: [id number] Procedure:                Colonoscopy Indications:              High risk colon cancer surveillance: Personal                            history of colonic polyps - 06/2015 - adenoma Medicines:                Monitored Anesthesia Care Procedure:                Pre-Anesthesia Assessment:                           - Prior to the procedure, a History and Physical                            was performed, and patient medications and                            allergies were reviewed. The patient's tolerance of                            previous anesthesia was also reviewed. The risks                            and benefits of the procedure and the sedation                            options and risks were discussed with the patient.                            All questions were answered, and informed consent                            was obtained. Prior Anticoagulants: The patient has                            taken no anticoagulant or antiplatelet agents. ASA                            Grade Assessment: II - A patient with mild systemic                            disease. After reviewing the risks and benefits,                            the patient was deemed in satisfactory condition to                            undergo the procedure.  After obtaining informed consent, the colonoscope                            was passed under direct vision. Throughout the                            procedure, the patient's blood pressure, pulse, and                            oxygen saturations were monitored continuously. The                            PCF-HQ190L Colonoscope was introduced through the                            anus  and advanced to the the cecum, identified by                            appendiceal orifice and ileocecal valve. The                            colonoscopy was performed without difficulty. The                            patient tolerated the procedure well. The quality                            of the bowel preparation was adequate. The                            ileocecal valve, appendiceal orifice, and rectum                            were photographed. Scope In: 8:49:43 AM Scope Out: 9:29:48 AM Scope Withdrawal Time: 0 hours 22 minutes 34 seconds  Total Procedure Duration: 0 hours 40 minutes 5 seconds  Findings:                 The perianal and digital rectal examinations were                            normal.                           Two sessile polyps were found in the ascending                            colon. The polyps were 3 to 4 mm in size. These                            polyps were removed with a cold snare. Resection                            and retrieval were complete.  A 5 mm polyp was found in the hepatic flexure. The                            polyp was flat. The polyp was removed with a cold                            snare. Resection and retrieval were complete.                           A large polypoid lesion was found in the suspected                            descending colon. The lesion was pedunculated with                            a thick stalk and had an ulcerated head. I suspect                            this could be a lipoma or inflammatory polyp on the                            appearance. Biopsies were taken with a cold forceps                            for histology. Area distal to the lesion was                            tattooed with an injection of Spot (carbon black).                           Internal hemorrhoids were found during retroflexion.                           The colon was markedly tortuous with  significant                            looping. Abdominal pressure utilized to achieve                            cecal intubation. Cecal intubation was challenging.                           The exam was otherwise normal throughout the                            examined colon. Complications:            No immediate complications. Estimated blood loss:                            Minimal. Estimated Blood Loss:     Estimated blood loss was minimal. Impression:               - Two 3 to  4 mm polyps in the ascending colon,                            removed with a cold snare. Resected and retrieved.                           - One 5 mm polyp at the hepatic flexure, removed                            with a cold snare. Resected and retrieved.                           - One large polypoid lesion in the descending colon                            as outlined. This may be a benign / inflamed                            lipomatous lesion, will await biopsy results. Not                            removed today given bleeding risk. Tattooed.                           - Internal hemorrhoids.                           - Tortuous colon with significant looping. Recommendation:           - Patient has a contact number available for                            emergencies. The signs and symptoms of potential                            delayed complications were discussed with the                            patient. Return to normal activities tomorrow.                            Written discharge instructions were provided to the                            patient.                           - Resume previous diet.                           - Continue present medications.                           - Await pathology results with further  recommendations Carlota Raspberry. Sequoia Witz, MD 07/31/2022 9:38:30 AM This report has been signed electronically.

## 2022-07-31 NOTE — Progress Notes (Signed)
Report given to PACU, vss 

## 2022-07-31 NOTE — Progress Notes (Signed)
Pt's states no medical or surgical changes since previsit or office visit. VS assessed by D.T 

## 2022-07-31 NOTE — Progress Notes (Signed)
Called to room to assist during endoscopic procedure.  Patient ID and intended procedure confirmed with present staff. Received instructions for my participation in the procedure from the performing physician.  

## 2022-08-03 ENCOUNTER — Telehealth: Payer: Self-pay

## 2022-08-03 NOTE — Telephone Encounter (Signed)
  Follow up Call-     07/31/2022    7:46 AM  Call back number  Post procedure Call Back phone  # [phone]  Permission to leave phone message Yes     Patient questions:  Do you have a fever, pain , or abdominal swelling? No. Pain Score  0 *  Have you tolerated food without any problems? Yes.    Have you been able to return to your normal activities? Yes.    Do you have any questions about your discharge instructions: Diet   No. Medications  No. Follow up visit  No.  Do you have questions or concerns about your Care? No.  Actions: * If pain score is 4 or above: No action needed, pain <4.

## 2022-08-06 ENCOUNTER — Encounter: Payer: Self-pay | Admitting: Gastroenterology

## 2022-08-11 DIAGNOSIS — D122 Benign neoplasm of ascending colon: Secondary | ICD-10-CM

## 2022-08-11 DIAGNOSIS — Z860100 Personal history of colon polyps, unspecified: Secondary | ICD-10-CM

## 2022-08-11 DIAGNOSIS — D123 Benign neoplasm of transverse colon: Secondary | ICD-10-CM

## 2022-08-11 NOTE — Telephone Encounter (Signed)
CT order in epic. Secure staff message sent to radiology scheduling to contact pt to set up her CT appt. Lab orders in epic. Patient notified via [place].

## 2022-08-12 ENCOUNTER — Other Ambulatory Visit (INDEPENDENT_AMBULATORY_CARE_PROVIDER_SITE_OTHER): Payer: BC Managed Care – PPO

## 2022-08-12 DIAGNOSIS — D123 Benign neoplasm of transverse colon: Secondary | ICD-10-CM | POA: Diagnosis not present

## 2022-08-12 DIAGNOSIS — D122 Benign neoplasm of ascending colon: Secondary | ICD-10-CM

## 2022-08-12 DIAGNOSIS — Z86010 Personal history of colonic polyps: Secondary | ICD-10-CM | POA: Diagnosis not present

## 2022-08-12 LAB — CBC WITH DIFFERENTIAL/PLATELET
Basophils Absolute: 0.1 10*3/uL (ref 0.0–0.1)
Basophils Relative: 0.8 % (ref 0.0–3.0)
Eosinophils Absolute: 0.1 10*3/uL (ref 0.0–0.7)
Eosinophils Relative: 0.7 % (ref 0.0–5.0)
HCT: 40 % (ref 36.0–46.0)
Hemoglobin: 13.4 g/dL (ref 12.0–15.0)
Lymphocytes Relative: 30.3 % (ref 12.0–46.0)
Lymphs Abs: 2.3 10*3/uL (ref 0.7–4.0)
MCHC: 33.5 g/dL (ref 30.0–36.0)
MCV: 93.9 fl (ref 78.0–100.0)
Monocytes Absolute: 0.6 10*3/uL (ref 0.1–1.0)
Monocytes Relative: 8.2 % (ref 3.0–12.0)
Neutro Abs: 4.6 10*3/uL (ref 1.4–7.7)
Neutrophils Relative %: 60 % (ref 43.0–77.0)
Platelets: 263 10*3/uL (ref 150.0–400.0)
RBC: 4.26 Mil/uL (ref 3.87–5.11)
RDW: 12.5 % (ref 11.5–15.5)
WBC: 7.6 10*3/uL (ref 4.0–10.5)

## 2022-08-12 LAB — IBC + FERRITIN
Ferritin: 279.6 ng/mL (ref 10.0–291.0)
Iron: 98 ug/dL (ref 42–145)
Saturation Ratios: 47.9 % (ref 20.0–50.0)
TIBC: 204.4 ug/dL — ABNORMAL LOW (ref 250.0–450.0)
Transferrin: 146 mg/dL — ABNORMAL LOW (ref 212.0–360.0)

## 2022-08-15 ENCOUNTER — Other Ambulatory Visit (HOSPITAL_COMMUNITY): Payer: Self-pay

## 2022-08-17 ENCOUNTER — Other Ambulatory Visit (HOSPITAL_COMMUNITY): Payer: Self-pay

## 2022-08-17 MED ORDER — SILVER SULFADIAZINE 1 % EX CREA
TOPICAL_CREAM | Freq: Every day | CUTANEOUS | 3 refills | Status: DC
  Filled 2022-08-17: qty 25, 30d supply, fill #0

## 2022-08-17 MED ORDER — MOUNJARO 5 MG/0.5ML ~~LOC~~ SOAJ
5.0000 mg | SUBCUTANEOUS | 11 refills | Status: DC
  Filled 2022-08-17 – 2022-09-19 (×3): qty 2, 28d supply, fill #0
  Filled 2022-10-15: qty 2, 28d supply, fill #1
  Filled 2022-11-09 – 2022-11-26 (×3): qty 2, 28d supply, fill #2
  Filled 2022-12-11 – 2022-12-14 (×4): qty 2, 28d supply, fill #3
  Filled 2023-01-08 – 2023-02-05 (×2): qty 2, 28d supply, fill #4

## 2022-08-18 ENCOUNTER — Other Ambulatory Visit (HOSPITAL_COMMUNITY): Payer: Self-pay

## 2022-08-22 ENCOUNTER — Encounter: Payer: Self-pay | Admitting: Gastroenterology

## 2022-08-26 ENCOUNTER — Encounter (HOSPITAL_COMMUNITY): Payer: Self-pay

## 2022-08-26 ENCOUNTER — Ambulatory Visit (HOSPITAL_COMMUNITY)
Admission: RE | Admit: 2022-08-26 | Discharge: 2022-08-26 | Disposition: A | Discharge status: Home / Self Care | Payer: BC Managed Care – PPO | Source: Ambulatory Visit | Attending: Gastroenterology | Admitting: Gastroenterology

## 2022-08-26 DIAGNOSIS — Z86010 Personal history of colonic polyps: Secondary | ICD-10-CM | POA: Insufficient documentation

## 2022-08-26 DIAGNOSIS — D122 Benign neoplasm of ascending colon: Secondary | ICD-10-CM | POA: Insufficient documentation

## 2022-08-26 DIAGNOSIS — D123 Benign neoplasm of transverse colon: Secondary | ICD-10-CM | POA: Insufficient documentation

## 2022-08-26 MED ORDER — IOHEXOL 300 MG/ML  SOLN
100.0000 mL | Freq: Once | INTRAMUSCULAR | Status: AC | PRN
  Administered 2022-08-26: 100 mL via INTRAVENOUS

## 2022-08-26 MED ORDER — IOHEXOL 9 MG/ML PO SOLN
ORAL | Status: AC
  Filled 2022-08-26: qty 1000

## 2022-08-27 ENCOUNTER — Encounter: Payer: Self-pay | Admitting: Gastroenterology

## 2022-08-28 ENCOUNTER — Telehealth: Payer: Self-pay

## 2022-08-28 NOTE — Telephone Encounter (Signed)
----- Message from Timothy Lasso, RN sent at 08/27/2022  8:36 AM EST ----- Regarding: FW: large polyp question  ----- Message ----- From: Irving Copas., MD Sent: 08/27/2022   4:13 AM EST To: Timothy Lasso, RN; Yetta Flock, MD Subject: RE: large polyp question                       SA, Impressive CT scan. Probably best for [place] to talk with her because this will be very high risk for bleeding. Surgical intervention or evaluation is not an unreasonable thing either. Talk with her and if she wants to meet me we will get her scheduled for a procedure in February based on availability but I certainly would want to talk with her as well. If she does not want to wait this long, you can refer her out. Reply back here and then we can have Morrison Masser work on scheduling as necessary. GM ----- Message ----- From: Yetta Flock, MD Sent: 08/26/2022  10:54 PM EST To: Irving Copas., MD Subject: RE: large polyp question                       Sloan Leiter, Just following up on this case. CT done, and it is visualized, but does not seem to be a lipoma. I suspect inflammatory polyp based on biopsies but given size I think reasonable to remove and she was wanting to proceed with this last time I spoke with her. Would you be okay to attempt at removing this? Can have her talk with you in clinic if you think that is best. Thanks as always for your thoughts.  Richardson Landry  ----- Message ----- From: Irving Copas., MD Sent: 08/11/2022   6:12 AM EST To: Yetta Flock, MD Subject: RE: large polyp question                       SA, Quite a large lesion. Get a CT abdomen/pelvis and lets make sure that it does not look like it is overtly lipomatous.  Even if it is, do think it is not unreasonable to attempt an endoscopic resection because of the ulceration and could cause her to develop anemia or iron deficiency or query intussusception.  Those labs checked recently?  Happy to  be available for her doing well otherwise but I will certainly get a CT scan first want to talk with her even though I would likely would use an Endoloop. Let me know what you all decide. I will forward to Reyansh Kushnir once you let me know what you guys GM ----- Message ----- From: Yetta Flock, MD Sent: 08/10/2022   6:25 PM EST To: Irving Copas., MD Subject: large polyp question                           Sloan Leiter,  This patient suprisingly grew a very large polyp in her left colon since I last did her exam 7 years ago.  It is a very thick stalk and I think actually an inflammatory polyp based on the biopsies but I do think likely needs to be removed given risk for bleeding and obstruction.  Can you take a look at the pictures and let me know what you think?  Would you be able to help do this at the hospital? Thanks for your opinion.  Richardson Landry

## 2022-08-30 NOTE — Telephone Encounter (Signed)
----- Message from Yetta Flock, MD sent at 08/28/2022  4:38 PM EST ----- Regarding: RE: large polyp question Gabe thanks very much.  I had a good discussion with her today.  She understands there are risks with removing this but she does want it removed and does not want surgery.  She is willing to have a procedure with you as an attempt to remove this.  Chong Sicilian can you schedule a clinic visit with Dr. Rush Landmark to discuss this and get her on the schedule with him at [facility] long for colonoscopy in February?  Thanks again.   ----- Message ----- From: Irving Copas., MD Sent: 08/27/2022   4:13 AM EST To: Timothy Lasso, RN; Yetta Flock, MD Subject: RE: large polyp question                       SA, Impressive CT scan. Probably best for [place] to talk with her because this will be very high risk for bleeding. Surgical intervention or evaluation is not an unreasonable thing either. Talk with her and if she wants to meet me we will get her scheduled for a procedure in February based on availability but I certainly would want to talk with her as well. If she does not want to wait this long, you can refer her out. Reply back here and then we can have Johnisha Louks work on scheduling as necessary. GM ----- Message ----- From: Yetta Flock, MD Sent: 08/26/2022  10:54 PM EST To: Irving Copas., MD Subject: RE: large polyp question                       Sloan Leiter, Just following up on this case. CT done, and it is visualized, but does not seem to be a lipoma. I suspect inflammatory polyp based on biopsies but given size I think reasonable to remove and she was wanting to proceed with this last time I spoke with her. Would you be okay to attempt at removing this? Can have her talk with you in clinic if you think that is best. Thanks as always for your thoughts.  Richardson Landry  ----- Message ----- From: Irving Copas., MD Sent: 08/11/2022   6:12 AM EST To: Yetta Flock, MD Subject: RE: large polyp question                       SA, Quite a large lesion. Get a CT abdomen/pelvis and lets make sure that it does not look like it is overtly lipomatous.  Even if it is, do think it is not unreasonable to attempt an endoscopic resection because of the ulceration and could cause her to develop anemia or iron deficiency or query intussusception.  Those labs checked recently?  Happy to be available for her doing well otherwise but I will certainly get a CT scan first want to talk with her even though I would likely would use an Endoloop. Let me know what you all decide. I will forward to Braelynn Lupton once you let me know what you guys GM ----- Message ----- From: Yetta Flock, MD Sent: 08/10/2022   6:25 PM EST To: Irving Copas., MD Subject: large polyp question                           Sloan Leiter,  This patient suprisingly grew a very large  polyp in her left colon since I last did her exam 7 years ago.  It is a very thick stalk and I think actually an inflammatory polyp based on the biopsies but I do think likely needs to be removed given risk for bleeding and obstruction.  Can you take a look at the pictures and let me know what you think?  Would you be able to help do this at the hospital? Thanks for your opinion.  Richardson Landry

## 2022-08-30 NOTE — Telephone Encounter (Signed)
----- Message from Irving Copas., MD sent at 08/29/2022  5:41 AM EST ----- Regarding: RE: large polyp question SA, Sounds good. Devlin Brink, Please schedule clinic visit (okay to use overbook or held slot in the morning or in the afternoon or 3:50 PM. February colonoscopy 90-minute EMR can be scheduled as well. Thanks. GM ----- Message ----- From: Yetta Flock, MD Sent: 08/28/2022   4:39 PM EST To: Timothy Lasso, RN; Irving Copas., MD Subject: RE: large polyp question                       Chester Holstein thanks very much.  I had a good discussion with her today.  She understands there are risks with removing this but she does want it removed and does not want surgery.  She is willing to have a procedure with you as an attempt to remove this.  Chong Sicilian can you schedule a clinic visit with Dr. Rush Landmark to discuss this and get her on the schedule with him at [facility] long for colonoscopy in February?  Thanks again.   ----- Message ----- From: Irving Copas., MD Sent: 08/27/2022   4:13 AM EST To: Timothy Lasso, RN; Yetta Flock, MD Subject: RE: large polyp question                       SA, Impressive CT scan. Probably best for [place] to talk with her because this will be very high risk for bleeding. Surgical intervention or evaluation is not an unreasonable thing either. Talk with her and if she wants to meet me we will get her scheduled for a procedure in February based on availability but I certainly would want to talk with her as well. If she does not want to wait this long, you can refer her out. Reply back here and then we can have Jackson Fetters work on scheduling as necessary. GM ----- Message ----- From: Yetta Flock, MD Sent: 08/26/2022  10:54 PM EST To: Irving Copas., MD Subject: RE: large polyp question                       Sloan Leiter, Just following up on this case. CT done, and it is visualized, but does not seem to be a lipoma. I  suspect inflammatory polyp based on biopsies but given size I think reasonable to remove and she was wanting to proceed with this last time I spoke with her. Would you be okay to attempt at removing this? Can have her talk with you in clinic if you think that is best. Thanks as always for your thoughts.  Richardson Landry  ----- Message ----- From: Irving Copas., MD Sent: 08/11/2022   6:12 AM EST To: Yetta Flock, MD Subject: RE: large polyp question                       SA, Quite a large lesion. Get a CT abdomen/pelvis and lets make sure that it does not look like it is overtly lipomatous.  Even if it is, do think it is not unreasonable to attempt an endoscopic resection because of the ulceration and could cause her to develop anemia or iron deficiency or query intussusception.  Those labs checked recently?  Happy to be available for her doing well otherwise but I will certainly get a CT scan first want to talk with her even though I  would likely would use an Endoloop. Let me know what you all decide. I will forward to Laikyn Gewirtz once you let me know what you guys GM ----- Message ----- From: Yetta Flock, MD Sent: 08/10/2022   6:25 PM EST To: Irving Copas., MD Subject: large polyp question                           Sloan Leiter,  This patient suprisingly grew a very large polyp in her left colon since I last did her exam 7 years ago.  It is a very thick stalk and I think actually an inflammatory polyp based on the biopsies but I do think likely needs to be removed given risk for bleeding and obstruction.  Can you take a look at the pictures and let me know what you think?  Would you be able to help do this at the hospital? Thanks for your opinion.  Richardson Landry

## 2022-08-31 ENCOUNTER — Other Ambulatory Visit: Payer: Self-pay

## 2022-08-31 ENCOUNTER — Encounter: Payer: Self-pay | Admitting: Gastroenterology

## 2022-08-31 DIAGNOSIS — Z86010 Personal history of colonic polyps: Secondary | ICD-10-CM

## 2022-08-31 NOTE — Telephone Encounter (Signed)
See results note dated 12/15.

## 2022-09-03 ENCOUNTER — Encounter: Payer: Self-pay | Admitting: Gastroenterology

## 2022-09-07 ENCOUNTER — Encounter: Payer: Self-pay | Admitting: Gastroenterology

## 2022-09-19 ENCOUNTER — Other Ambulatory Visit (HOSPITAL_COMMUNITY): Payer: Self-pay

## 2022-09-22 ENCOUNTER — Encounter: Payer: Self-pay | Admitting: Gastroenterology

## 2022-09-22 ENCOUNTER — Ambulatory Visit (INDEPENDENT_AMBULATORY_CARE_PROVIDER_SITE_OTHER): Payer: BC Managed Care – PPO | Admitting: Gastroenterology

## 2022-09-22 ENCOUNTER — Other Ambulatory Visit (INDEPENDENT_AMBULATORY_CARE_PROVIDER_SITE_OTHER): Payer: BC Managed Care – PPO

## 2022-09-22 VITALS — BP 118/70 | HR 85 | Ht 64.0 in | Wt 141.8 lb

## 2022-09-22 DIAGNOSIS — Z86010 Personal history of colonic polyps: Secondary | ICD-10-CM

## 2022-09-22 DIAGNOSIS — Z9049 Acquired absence of other specified parts of digestive tract: Secondary | ICD-10-CM

## 2022-09-22 DIAGNOSIS — R933 Abnormal findings on diagnostic imaging of other parts of digestive tract: Secondary | ICD-10-CM

## 2022-09-22 DIAGNOSIS — K529 Noninfective gastroenteritis and colitis, unspecified: Secondary | ICD-10-CM | POA: Diagnosis not present

## 2022-09-22 DIAGNOSIS — K639 Disease of intestine, unspecified: Secondary | ICD-10-CM | POA: Diagnosis not present

## 2022-09-22 LAB — BASIC METABOLIC PANEL
BUN: 11 mg/dL (ref 6–23)
CO2: 30 mEq/L (ref 19–32)
Calcium: 8.1 mg/dL — ABNORMAL LOW (ref 8.4–10.5)
Chloride: 104 mEq/L (ref 96–112)
Creatinine, Ser: 0.73 mg/dL (ref 0.40–1.20)
GFR: 91.28 mL/min (ref >60.00)
Glucose, Bld: 93 mg/dL (ref 70–99)
Potassium: 4.8 mEq/L (ref 3.5–5.1)
Sodium: 139 mEq/L (ref 135–145)

## 2022-09-22 MED ORDER — CHOLESTYRAMINE 4 G PO PACK: 4.0000 g | PACK | Freq: Every day | ORAL | 4 refills | Status: DC

## 2022-09-22 MED ORDER — METOCLOPRAMIDE HCL 5 MG PO TABS: ORAL_TABLET | ORAL | 0 refills | Status: DC

## 2022-09-22 NOTE — Patient Instructions (Addendum)
We have sent the following medications to your pharmacy for you to pick up at your convenience: Cholestyramine   Your provider has requested that you go to the basement level for lab work before leaving today. Press "B" on the elevator. The lab is located at the first door on the left as you exit the elevator.  Due to recent changes in healthcare laws, you may see the results of your imaging and laboratory studies on MyChart before your provider has had a chance to review them.  We understand that in some cases there may be results that are confusing or concerning to you. Not all laboratory results come back in the same time frame and the provider may be waiting for multiple results in order to interpret others.  Please give [place] 48 hours in order for your provider to thoroughly review all the results before contacting the office for clarification of your results.   Thank you for choosing me and [facility].  Dr. Rush Landmark

## 2022-09-23 ENCOUNTER — Encounter: Payer: Self-pay | Admitting: Gastroenterology

## 2022-09-23 DIAGNOSIS — R933 Abnormal findings on diagnostic imaging of other parts of digestive tract: Secondary | ICD-10-CM | POA: Insufficient documentation

## 2022-09-23 DIAGNOSIS — K639 Disease of intestine, unspecified: Secondary | ICD-10-CM | POA: Insufficient documentation

## 2022-09-23 DIAGNOSIS — Z86010 Personal history of colonic polyps: Secondary | ICD-10-CM | POA: Insufficient documentation

## 2022-09-23 NOTE — Progress Notes (Addendum)
[facility] VISIT   Primary Care Provider [name], [place] [address] [phone]  Referring Provider Dr. Havery Moros  Patient Profile: Katherine Todd is a 57 y.o. female with a pmh significant for hyperlipidemia, anxiety, MDD, arthritis, status post mini gastric bypass, status postcholecystectomy, GERD, colon polyps (TA's as well as a large DC inflammatory polyp left in situ).   The patient presents to the [facility] for an evaluation and management of problem(s) noted below:  Problem List 1. Lesion of colon   2. Hx of adenomatous colonic polyps   3. Abnormal colonoscopy   4. Chronic diarrhea   5. History of cholecystectomy     History of Present Illness This is the patient's first visit to the outpatient [facility].  She follows with Dr. Havery Moros for colonoscopy for history of colon polyps.  On her most recent colonoscopy this year found to have a very large pedunculated lesion in the descending colon that on biopsies returned as an inflammatory polyp.  It is for this reason that the patient is referred to consider advanced resection attempt to prevent surgery if possible.  Imaging was performed that showed an intraluminal mass but no evidence of any other significant abnormality.  The patient has had longstanding issues of diarrhea.  She cannot state if this predates her cholecystectomy as her cholecystectomy happened years ago.  She has never discussed this with Dr. Havery Moros in the past.  She will have between 2 and 4 bowel movements per day sometimes these are softer or looser quality.  She has never been on cholestyramine/WelChol.  The patient does not have any significant GI symptoms regards to abdominal pain or discomfort or changes in her bowel habits.  She has not noted any blood in her stools.  GI Review of Systems Positive as above Negative for dysphagia, odynophagia, nausea, vomiting,  alteration of bowel habits  Review of Systems General: Denies fevers/chills/weight loss unintentionally Cardiovascular: Denies chest pain Pulmonary: Denies shortness of breath Gastroenterological: See HPI Genitourinary: Denies darkened urine Hematological: Denies easy bruising/bleeding Dermatological: Denies jaundice Psychological: Mood is stable   Medications Current Outpatient Medications  Medication Sig Dispense Refill   ARIPiprazole (ABILIFY) 5 MG tablet Take 2.5 mg by mouth daily.     BRINTELLIX 20 MG TABS Take 20 mg by mouth daily.     buPROPion (WELLBUTRIN XL) 300 MG 24 hr tablet Take 1 tablet by mouth daily.     Calcium 500-100 MG-UNIT CHEW Chew by mouth daily as needed.     cetirizine (ZYRTEC) 10 MG tablet Take 10 mg by mouth daily.     Cholecalciferol (VITAMIN D3) 400 UNIT/ML LIQD Take by mouth daily.     cholestyramine (QUESTRAN) 4 g packet Take 1 packet (4 g total) by mouth daily. 30 each 4   clonazePAM (KLONOPIN) 0.5 MG tablet Take 0.5 mg by mouth 2 (two) times daily as needed for anxiety.     cyanocobalamin 500 MCG tablet Take 500 mcg by mouth daily.     denosumab (PROLIA) 60 MG/ML SOSY injection Inject 60 mg into the skin every 6 (six) months.     famotidine (PEPCID) 20 MG tablet Take 20 mg by mouth 2 (two) times daily.     ipratropium (ATROVENT) 0.03 % nasal spray USE 2 SPRAYS IN EACH NOSTRIL TID AS NEEDED  2   levothyroxine (SYNTHROID) 50 MCG tablet Take 50 mcg by mouth every morning.     lovastatin (MEVACOR) 20 MG  tablet Take 20 mg by mouth once a week.     meloxicam (MOBIC) 15 MG tablet daily as needed.  6   metoCLOPramide (REGLAN) 5 MG tablet Take 1 tablet by mouth 30 min prior to drinking preparation for colonoscopy. 2 tablet 0   Multiple Vitamin (MULTIVITAMIN) tablet Take 1 tablet by mouth 2 (two) times daily.      Multiple Vitamins-Minerals (OCUVITE EXTRA PO) Take 1 tablet by mouth daily.     Omega-3 Fatty Acids (FISH OIL) 1000 MG CAPS Take by mouth daily.      propranolol (INDERAL) 10 MG tablet Take 1 tablet by mouth daily as needed.  12   SUMAtriptan (IMITREX) 100 MG tablet TAKE 1 TABLET BY MOUTH ONCE AS NEEDED FOR MIGRAINE(MAY REPEAT IN 2 HORUS. MAX OF 2 TABLETS PER 24 HOURS)     tirzepatide (MOUNJARO) 2.5 MG/0.5ML Pen Inject 2.5 mg into the skin once a week. 2 mL 5   tirzepatide (MOUNJARO) 5 MG/0.5ML Pen Inject 5 mg into the skin once a week. 2 mL 11   triamterene-hydrochlorothiazide (MAXZIDE-25) 37.5-25 MG tablet Take 0.5 tablets by mouth every morning.     Vitamin D, Ergocalciferol, (DRISDOL) 50000 UNITS CAPS Take 50,000 Units by mouth daily.      No current facility-administered medications for this visit.    Allergies Allergies  Allergen Reactions   [employer] Diphedryl [Diphenhydramine] Other (See Comments)    Heart palpitations  Heart palpitations    Atorvastatin Other (See Comments)    Lipitor= Muscle aches   Adhesive [Tape] Rash    Histories Past Medical History:  Diagnosis Date   Allergy    Anemia    Anxiety    Arthritis    Cataract    Depression    Fibrocystic breast    Fibromyalgia    11/26/16 'resolved'   GERD (gastroesophageal reflux disease)    Hyperlipidemia    Migraines    Obesity    Osteoporosis    Sleep apnea    not currently   Thyroid disease    Past Surgical History:  Procedure Laterality Date   GASTRIC BYPASS  04/2007, revision 2009 and 2011   "Mini" gastric bypass   HERNIA REPAIR  2009   incisional repair from gastsric bypass   LAPAROSCOPIC CHOLECYSTECTOMY  09/2002   Dr. March Rummage   TONSILLECTOMY AND ADENOIDECTOMY  age  48   WISDOM TOOTH EXTRACTION  age 62   Social History   Socioeconomic History   Marital status: Single    Spouse name: Not on file   Number of children: 0   Years of education: 20   Highest education level: Not on file  Occupational History    Comment: works for [employer]  Tobacco Use   Smoking status: Never   Smokeless tobacco: Never  Vaping Use   Vaping Use: Never used   Substance and Sexual Activity   Alcohol use: No   Drug use: No   Sexual activity: Not Currently    Birth control/protection: Post-menopausal  Other Topics Concern   Not on file  Social History Narrative   Lives alone   Caffeine- coffee, 2-3 cups daily, soda 1 daily   Social Determinants of Health   Financial Resource Strain: Not on file  Food Insecurity: Not on file  Transportation Needs: Not on file  Physical Activity: Not on file  Stress: Not on file  Social Connections: Not on file  Intimate Partner Violence: Not on file   Family History  Problem Relation  Age of Onset   Hyperlipidemia Mother    Prostate cancer Father 24   Thyroid disease Father    Melanoma Brother 88       back of knee    Stroke Maternal Grandmother    Colon cancer Maternal Grandfather 77       colon caner   Pancreatic cancer Paternal Grandmother            Breast cancer Neg Hx    Esophageal cancer Neg Hx    Stomach cancer Neg Hx    Rectal cancer Neg Hx    Inflammatory bowel disease Neg Hx    Liver disease Neg Hx    I have reviewed her medical, social, and family history in detail and updated the electronic medical record as necessary.    PHYSICAL EXAMINATION  BP 118/70   Pulse 85   Ht '5\' 4"'$  (1.626 m)   Wt 141 lb 12.8 oz (64.3 kg)   LMP 09/14/2010   BMI 24.34 kg/m  Wt Readings from Last 3 Encounters:  09/22/22 141 lb 12.8 oz (64.3 kg)  07/31/22 140 lb 9.6 oz (63.8 kg)  06/26/22 140 lb 9.6 oz (63.8 kg)  GEN: NAD, appears stated age, doesn't appear chronically ill PSYCH: Cooperative, without pressured speech EYE: Conjunctivae pink, sclerae anicteric ENT: MMM CV: Nontachycardic RESP: No audible wheezing GI: NABS, soft, NT/ND, without rebound or guarding MSK/EXT: No significant lower extremity edema SKIN: No jaundice NEURO:  Alert & Oriented x 3, no focal deficits   REVIEW OF DATA  I reviewed the following data at the time of this encounter:  GI Procedures and Studies  November  2023 colonoscopy - Two 3 to 4 mm polyps in the ascending colon, removed with a cold snare. Resected and retrieved. - One 5 mm polyp at the hepatic flexure, removed with a cold snare. Resected and retrieved. - One large polypoid lesion in the descending colon as outlined. This may be a benign / inflamed lipomatous lesion, will await biopsy results. Not removed today given bleeding risk. Tattooed. - Internal hemorrhoids. - Tortuous colon with significant looping.  Laboratory Studies  Reviewed those in epic  Imaging Studies  December 2023 CT abdomen pelvis with contrast IMPRESSION: 5.6 cm low-attenuation intraluminal mass in proximal descending colon, without internal fat or other specific characteristics. Mucinous neoplasm and malignancy cannot be excluded. Short-segment small bowel intussusception in the pelvis. These are typically idiopathic and self-limited. Consider short-term imaging follow-up with CT enterography. No evidence of metastatic disease within the abdomen or pelvis.   ASSESSMENT  Ms. Pancoast is a 57 y.o. female with a pmh significant for hyperlipidemia, anxiety, MDD, arthritis, status post mini gastric bypass, status postcholecystectomy, GERD, colon polyps (TA's as well as a large DC inflammatory polyp left in situ).  The patient is seen today for evaluation and management of:  1. Lesion of colon   2. Hx of adenomatous colonic polyps   3. Abnormal colonoscopy   4. Chronic diarrhea   5. History of cholecystectomy    The patient is hemodynamically and clinically stable.  Based upon the description and endoscopic pictures I do feel that it is reasonable to pursue an Advanced Polypectomy attempt of the polyp/lesion.  I wonder if a pedunculated lipoma is playing a role or if this is truly just an inflammatory polyp.  Risk of bleeding is going to be higher for this patient.  We discussed some of the techniques of advanced polypectomy which include Endoscopic Mucosal Resection,  OVESCO Full-Thickness  Resection, Endorotor Morcellation, and Tissue Ablation via Fulguration.  In this particular lesion, I expect that we will try to use epinephrine and a Polyloop to decrease the risk of bleeding is much as possible.  EUS may be helpful as well to decrease the risk/chance that there is Muscularis Propria involvement of the lesion that may then not allow an approach at endoscopic resection.  We also reviewed images of typical techniques as noted above.  The risks and benefits of endoscopic evaluation were discussed with the patient; these include but are not limited to the risk of perforation, infection, bleeding, missed lesions, lack of diagnosis, severe illness requiring hospitalization, as well as anesthesia and sedation related illnesses.  During attempts at advanced resection, the risks of bleeding and perforation/leak are increased as opposed to diagnostic and screening procedures, and that was discussed with the patient as well.   In addition, I explained that with the possible need for piecemeal resection, subsequent short-interval endoscopic evaluation for follow up and potential retreatment of the lesion/area may be necessary.  I did offer, a referral to surgery in order for patient to have opportunity to discuss surgical management/intervention prior to finalizing decision for attempt at endoscopic removal, however, the patient deferred on this.  If, after attempt at removal of the polyp/lesion, it is found that the patient has a complication or that an invasive lesion or malignant lesion is found, or that the polyp/lesion continues to recur, the patient is aware and understands that surgery may still be indicated/required.  In the setting of the patient's chronic diarrhea and looser bowel movements for years, I recommend initiation of a bile salt resin/sequestrant.  We will initiate cholestyramine to see how she does in the coming weeks or months.  She will follow-up with Dr. Havery Moros  for further changes of her bowel habits depending on how she does.  All patient questions were answered, to the best of my ability, and the patient agrees to the aforementioned plan of action with follow-up as indicated.   PLAN  Preprocedure labs to be obtained as outlined below Proceed with scheduling colonoscopy with EUS with EMR attempt Initiate cholestyramine 1 packet daily and titrate upwards as needed for diarrheal symptoms Follow-up to be dictated based on results of colonoscopy in case surgical intervention is required   Orders Placed This Encounter  Procedures   Basic Metabolic Panel (BMET)    New Prescriptions   CHOLESTYRAMINE (QUESTRAN) 4 G PACKET    Take 1 packet (4 g total) by mouth daily.   METOCLOPRAMIDE (REGLAN) 5 MG TABLET    Take 1 tablet by mouth 30 min prior to drinking preparation for colonoscopy.   Modified Medications   No medications on file    Planned Follow Up No follow-ups on file.   Total Time in Face-to-Face and in Coordination of Care for patient including independent/personal interpretation/review of prior testing, medical history, examination, medication adjustment, communicating results with the patient directly, and documentation within the EHR is 35 minutes.   Justice Britain, MD [facility] Advanced [facility]oscopy Office # [phone]

## 2022-09-23 NOTE — H&P (View-Only) (Signed)
[facility] VISIT   Primary Care Provider [name], [place] [address] [phone]  Referring Provider Dr. Havery Moros  Patient Profile: Katherine Todd is a 57 y.o. female with a pmh significant for hyperlipidemia, anxiety, MDD, arthritis, status post mini gastric bypass, status postcholecystectomy, GERD, colon polyps (TA's as well as a large DC inflammatory polyp left in situ).   The patient presents to the [facility] for an evaluation and management of problem(s) noted below:  Problem List 1. Lesion of colon   2. Hx of adenomatous colonic polyps   3. Abnormal colonoscopy   4. Chronic diarrhea   5. History of cholecystectomy     History of Present Illness This is the patient's first visit to the outpatient [facility] clinic.  She follows with Dr. Havery Moros for colonoscopy for history of colon polyps.  On her most recent colonoscopy this year found to have a very large pedunculated lesion in the descending colon that on biopsies returned as an inflammatory polyp.  It is for this reason that the patient is referred to consider advanced resection attempt to prevent surgery if possible.  Imaging was performed that showed an intraluminal mass but no evidence of any other significant abnormality.  The patient has had longstanding issues of diarrhea.  She cannot state if this predates her cholecystectomy as her cholecystectomy happened years ago.  She has never discussed this with Dr. Havery Moros in the past.  She will have between 2 and 4 bowel movements per day sometimes these are softer or looser quality.  She has never been on cholestyramine/WelChol.  The patient does not have any significant GI symptoms regards to abdominal pain or discomfort or changes in her bowel habits.  She has not noted any blood in her stools.  GI Review of Systems Positive as above Negative for dysphagia, odynophagia, nausea, vomiting,  alteration of bowel habits  Review of Systems General: Denies fevers/chills/weight loss unintentionally Cardiovascular: Denies chest pain Pulmonary: Denies shortness of breath Gastroenterological: See HPI Genitourinary: Denies darkened urine Hematological: Denies easy bruising/bleeding Dermatological: Denies jaundice Psychological: Mood is stable   Medications Current Outpatient Medications  Medication Sig Dispense Refill   ARIPiprazole (ABILIFY) 5 MG tablet Take 2.5 mg by mouth daily.     BRINTELLIX 20 MG TABS Take 20 mg by mouth daily.     buPROPion (WELLBUTRIN XL) 300 MG 24 hr tablet Take 1 tablet by mouth daily.     Calcium 500-100 MG-UNIT CHEW Chew by mouth daily as needed.     cetirizine (ZYRTEC) 10 MG tablet Take 10 mg by mouth daily.     Cholecalciferol (VITAMIN D3) 400 UNIT/ML LIQD Take by mouth daily.     cholestyramine (QUESTRAN) 4 g packet Take 1 packet (4 g total) by mouth daily. 30 each 4   clonazePAM (KLONOPIN) 0.5 MG tablet Take 0.5 mg by mouth 2 (two) times daily as needed for anxiety.     cyanocobalamin 500 MCG tablet Take 500 mcg by mouth daily.     denosumab (PROLIA) 60 MG/ML SOSY injection Inject 60 mg into the skin every 6 (six) months.     famotidine (PEPCID) 20 MG tablet Take 20 mg by mouth 2 (two) times daily.     ipratropium (ATROVENT) 0.03 % nasal spray USE 2 SPRAYS IN EACH NOSTRIL TID AS NEEDED  2   levothyroxine (SYNTHROID) 50 MCG tablet Take 50 mcg by mouth every morning.     lovastatin (MEVACOR) 20 MG  tablet Take 20 mg by mouth once a week.     meloxicam (MOBIC) 15 MG tablet daily as needed.  6   metoCLOPramide (REGLAN) 5 MG tablet Take 1 tablet by mouth 30 min prior to drinking preparation for colonoscopy. 2 tablet 0   Multiple Vitamin (MULTIVITAMIN) tablet Take 1 tablet by mouth 2 (two) times daily.      Multiple Vitamins-Minerals (OCUVITE EXTRA PO) Take 1 tablet by mouth daily.     Omega-3 Fatty Acids (FISH OIL) 1000 MG CAPS Take by mouth daily.      propranolol (INDERAL) 10 MG tablet Take 1 tablet by mouth daily as needed.  12   SUMAtriptan (IMITREX) 100 MG tablet TAKE 1 TABLET BY MOUTH ONCE AS NEEDED FOR MIGRAINE(MAY REPEAT IN 2 HORUS. MAX OF 2 TABLETS PER 24 HOURS)     tirzepatide (MOUNJARO) 2.5 MG/0.5ML Pen Inject 2.5 mg into the skin once a week. 2 mL 5   tirzepatide (MOUNJARO) 5 MG/0.5ML Pen Inject 5 mg into the skin once a week. 2 mL 11   triamterene-hydrochlorothiazide (MAXZIDE-25) 37.5-25 MG tablet Take 0.5 tablets by mouth every morning.     Vitamin D, Ergocalciferol, (DRISDOL) 50000 UNITS CAPS Take 50,000 Units by mouth daily.      No current facility-administered medications for this visit.    Allergies Allergies  Allergen Reactions   [employer] Diphedryl [Diphenhydramine] Other (See Comments)    Heart palpitations  Heart palpitations    Atorvastatin Other (See Comments)    Lipitor= Muscle aches   Adhesive [Tape] Rash    Histories Past Medical History:  Diagnosis Date   Allergy    Anemia    Anxiety    Arthritis    Cataract    Depression    Fibrocystic breast    Fibromyalgia    11/26/16 'resolved'   GERD (gastroesophageal reflux disease)    Hyperlipidemia    Migraines    Obesity    Osteoporosis    Sleep apnea    not currently   Thyroid disease    Past Surgical History:  Procedure Laterality Date   GASTRIC BYPASS  04/2007, revision 2009 and 2011   "Mini" gastric bypass   HERNIA REPAIR  2009   incisional repair from gastsric bypass   LAPAROSCOPIC CHOLECYSTECTOMY  09/2002   Dr. March Rummage   TONSILLECTOMY AND ADENOIDECTOMY  age  33   WISDOM TOOTH EXTRACTION  age 74   Social History   Socioeconomic History   Marital status: Single    Spouse name: Not on file   Number of children: 0   Years of education: 20   Highest education level: Not on file  Occupational History    Comment: works for [employer]  Tobacco Use   Smoking status: Never   Smokeless tobacco: Never  Vaping Use   Vaping Use: Never used   Substance and Sexual Activity   Alcohol use: No   Drug use: No   Sexual activity: Not Currently    Birth control/protection: Post-menopausal  Other Topics Concern   Not on file  Social History Narrative   Lives alone   Caffeine- coffee, 2-3 cups daily, soda 1 daily   Social Determinants of Health   Financial Resource Strain: Not on file  Food Insecurity: Not on file  Transportation Needs: Not on file  Physical Activity: Not on file  Stress: Not on file  Social Connections: Not on file  Intimate Partner Violence: Not on file   Family History  Problem Relation  Age of Onset   Hyperlipidemia Mother    Prostate cancer Father 41   Thyroid disease Father    Melanoma Brother 12       back of knee    Stroke Maternal Grandmother    Colon cancer Maternal Grandfather 58       colon caner   Pancreatic cancer Paternal Grandmother            Breast cancer Neg Hx    Esophageal cancer Neg Hx    Stomach cancer Neg Hx    Rectal cancer Neg Hx    Inflammatory bowel disease Neg Hx    Liver disease Neg Hx    I have reviewed her medical, social, and family history in detail and updated the electronic medical record as necessary.    PHYSICAL EXAMINATION  BP 118/70   Pulse 85   Ht '5\' 4"'$  (1.626 m)   Wt 141 lb 12.8 oz (64.3 kg)   LMP 09/14/2010   BMI 24.34 kg/m  Wt Readings from Last 3 Encounters:  09/22/22 141 lb 12.8 oz (64.3 kg)  07/31/22 140 lb 9.6 oz (63.8 kg)  06/26/22 140 lb 9.6 oz (63.8 kg)  GEN: NAD, appears stated age, doesn't appear chronically ill PSYCH: Cooperative, without pressured speech EYE: Conjunctivae pink, sclerae anicteric ENT: MMM CV: Nontachycardic RESP: No audible wheezing GI: NABS, soft, NT/ND, without rebound or guarding MSK/EXT: No significant lower extremity edema SKIN: No jaundice NEURO:  Alert & Oriented x 3, no focal deficits   REVIEW OF DATA  I reviewed the following data at the time of this encounter:  GI Procedures and Studies  November  2023 colonoscopy - Two 3 to 4 mm polyps in the ascending colon, removed with a cold snare. Resected and retrieved. - One 5 mm polyp at the hepatic flexure, removed with a cold snare. Resected and retrieved. - One large polypoid lesion in the descending colon as outlined. This may be a benign / inflamed lipomatous lesion, will await biopsy results. Not removed today given bleeding risk. Tattooed. - Internal hemorrhoids. - Tortuous colon with significant looping.  Laboratory Studies  Reviewed those in epic  Imaging Studies  December 2023 CT abdomen pelvis with contrast IMPRESSION: 5.6 cm low-attenuation intraluminal mass in proximal descending colon, without internal fat or other specific characteristics. Mucinous neoplasm and malignancy cannot be excluded. Short-segment small bowel intussusception in the pelvis. These are typically idiopathic and self-limited. Consider short-term imaging follow-up with CT enterography. No evidence of metastatic disease within the abdomen or pelvis.   ASSESSMENT  Ms. Pizana is a 57 y.o. female with a pmh significant for hyperlipidemia, anxiety, MDD, arthritis, status post mini gastric bypass, status postcholecystectomy, GERD, colon polyps (TA's as well as a large DC inflammatory polyp left in situ).  The patient is seen today for evaluation and management of:  1. Lesion of colon   2. Hx of adenomatous colonic polyps   3. Abnormal colonoscopy   4. Chronic diarrhea   5. History of cholecystectomy    The patient is hemodynamically and clinically stable.  Based upon the description and endoscopic pictures I do feel that it is reasonable to pursue an Advanced Polypectomy attempt of the polyp/lesion.  I wonder if a pedunculated lipoma is playing a role or if this is truly just an inflammatory polyp.  Risk of bleeding is going to be higher for this patient.  We discussed some of the techniques of advanced polypectomy which include Endoscopic Mucosal Resection,  OVESCO Full-Thickness  Resection, Endorotor Morcellation, and Tissue Ablation via Fulguration.  In this particular lesion, I expect that we will try to use epinephrine and a Polyloop to decrease the risk of bleeding is much as possible.  We also reviewed images of typical techniques as noted above.  The risks and benefits of endoscopic evaluation were discussed with the patient; these include but are not limited to the risk of perforation, infection, bleeding, missed lesions, lack of diagnosis, severe illness requiring hospitalization, as well as anesthesia and sedation related illnesses.  During attempts at advanced resection, the risks of bleeding and perforation/leak are increased as opposed to diagnostic and screening procedures, and that was discussed with the patient as well.   In addition, I explained that with the possible need for piecemeal resection, subsequent short-interval endoscopic evaluation for follow up and potential retreatment of the lesion/area may be necessary.  I did offer, a referral to surgery in order for patient to have opportunity to discuss surgical management/intervention prior to finalizing decision for attempt at endoscopic removal, however, the patient deferred on this.  If, after attempt at removal of the polyp/lesion, it is found that the patient has a complication or that an invasive lesion or malignant lesion is found, or that the polyp/lesion continues to recur, the patient is aware and understands that surgery may still be indicated/required.  In the setting of the patient's chronic diarrhea and looser bowel movements for years, I recommend initiation of a bile salt resin/sequestrant.  We will initiate cholestyramine to see how she does in the coming weeks or months.  She will follow-up with Dr. Havery Moros for further changes of her bowel habits depending on how she does.  All patient questions were answered, to the best of my ability, and the patient agrees to the  aforementioned plan of action with follow-up as indicated.   PLAN  Preprocedure labs to be obtained as outlined below Proceed with scheduling colonoscopy with EMR attempt Initiate cholestyramine 1 packet daily and titrate upwards as needed for diarrheal symptoms Follow-up to be dictated based on results of colonoscopy in case surgical intervention is required   Orders Placed This Encounter  Procedures   Basic Metabolic Panel (BMET)    New Prescriptions   CHOLESTYRAMINE (QUESTRAN) 4 G PACKET    Take 1 packet (4 g total) by mouth daily.   METOCLOPRAMIDE (REGLAN) 5 MG TABLET    Take 1 tablet by mouth 30 min prior to drinking preparation for colonoscopy.   Modified Medications   No medications on file    Planned Follow Up No follow-ups on file.   Total Time in Face-to-Face and in Coordination of Care for patient including independent/personal interpretation/review of prior testing, medical history, examination, medication adjustment, communicating results with the patient directly, and documentation within the EHR is 35 minutes.   Justice Britain, MD [facility] Office # [phone]

## 2022-09-23 NOTE — Telephone Encounter (Signed)
Dr Rush Landmark how long do you suggest the pt refrain from pilates after colon EMR?

## 2022-09-25 DIAGNOSIS — Z9049 Acquired absence of other specified parts of digestive tract: Secondary | ICD-10-CM | POA: Insufficient documentation

## 2022-09-25 DIAGNOSIS — K529 Noninfective gastroenteritis and colitis, unspecified: Secondary | ICD-10-CM | POA: Insufficient documentation

## 2022-10-08 ENCOUNTER — Encounter: Payer: Self-pay | Admitting: Gastroenterology

## 2022-10-08 ENCOUNTER — Encounter (HOSPITAL_COMMUNITY): Payer: Self-pay | Admitting: Gastroenterology

## 2022-10-15 ENCOUNTER — Encounter (HOSPITAL_COMMUNITY)
Admission: RE | Disposition: A | Discharge status: Home / Self Care | Payer: Self-pay | Source: Ambulatory Visit | Attending: Gastroenterology

## 2022-10-15 ENCOUNTER — Encounter: Payer: Self-pay | Admitting: Gastroenterology

## 2022-10-15 ENCOUNTER — Encounter (HOSPITAL_COMMUNITY): Payer: Self-pay | Admitting: Gastroenterology

## 2022-10-15 ENCOUNTER — Ambulatory Visit (HOSPITAL_COMMUNITY)
Admission: RE | Admit: 2022-10-15 | Discharge: 2022-10-15 | Disposition: A | Discharge status: Home / Self Care | Payer: BC Managed Care – PPO | Source: Ambulatory Visit | Attending: Gastroenterology | Admitting: Gastroenterology

## 2022-10-15 ENCOUNTER — Ambulatory Visit (HOSPITAL_COMMUNITY): Payer: BC Managed Care – PPO | Admitting: Certified Registered Nurse Anesthetist

## 2022-10-15 DIAGNOSIS — K219 Gastro-esophageal reflux disease without esophagitis: Secondary | ICD-10-CM | POA: Diagnosis not present

## 2022-10-15 DIAGNOSIS — D122 Benign neoplasm of ascending colon: Secondary | ICD-10-CM | POA: Diagnosis not present

## 2022-10-15 DIAGNOSIS — Z9884 Bariatric surgery status: Secondary | ICD-10-CM | POA: Insufficient documentation

## 2022-10-15 DIAGNOSIS — K529 Noninfective gastroenteritis and colitis, unspecified: Secondary | ICD-10-CM | POA: Diagnosis not present

## 2022-10-15 DIAGNOSIS — K644 Residual hemorrhoidal skin tags: Secondary | ICD-10-CM | POA: Insufficient documentation

## 2022-10-15 DIAGNOSIS — F419 Anxiety disorder, unspecified: Secondary | ICD-10-CM | POA: Insufficient documentation

## 2022-10-15 DIAGNOSIS — D123 Benign neoplasm of transverse colon: Secondary | ICD-10-CM | POA: Diagnosis not present

## 2022-10-15 DIAGNOSIS — M1990 Unspecified osteoarthritis, unspecified site: Secondary | ICD-10-CM | POA: Diagnosis not present

## 2022-10-15 DIAGNOSIS — Z86010 Personal history of colonic polyps: Secondary | ICD-10-CM

## 2022-10-15 DIAGNOSIS — Z9049 Acquired absence of other specified parts of digestive tract: Secondary | ICD-10-CM | POA: Insufficient documentation

## 2022-10-15 DIAGNOSIS — F329 Major depressive disorder, single episode, unspecified: Secondary | ICD-10-CM | POA: Insufficient documentation

## 2022-10-15 DIAGNOSIS — G4730 Sleep apnea, unspecified: Secondary | ICD-10-CM | POA: Insufficient documentation

## 2022-10-15 DIAGNOSIS — Q438 Other specified congenital malformations of intestine: Secondary | ICD-10-CM | POA: Insufficient documentation

## 2022-10-15 DIAGNOSIS — E785 Hyperlipidemia, unspecified: Secondary | ICD-10-CM | POA: Diagnosis not present

## 2022-10-15 DIAGNOSIS — D124 Benign neoplasm of descending colon: Secondary | ICD-10-CM

## 2022-10-15 HISTORY — PX: HEMOSTASIS CONTROL: SHX6838

## 2022-10-15 HISTORY — PX: EUS: SHX5427

## 2022-10-15 HISTORY — PX: POLYPECTOMY: SHX5525

## 2022-10-15 HISTORY — PX: HEMOSTASIS CLIP PLACEMENT: SHX6857

## 2022-10-15 HISTORY — PX: COLONOSCOPY WITH PROPOFOL: SHX5780

## 2022-10-15 HISTORY — PX: ENDOSCOPIC MUCOSAL RESECTION: SHX6839

## 2022-10-15 SURGERY — COLONOSCOPY WITH PROPOFOL
Anesthesia: Monitor Anesthesia Care

## 2022-10-15 MED ORDER — LACTATED RINGERS IV SOLN: INTRAVENOUS | Status: DC

## 2022-10-15 MED ORDER — PROPOFOL 10 MG/ML IV BOLUS
INTRAVENOUS | Status: AC
  Filled 2022-10-15: qty 20

## 2022-10-15 MED ORDER — SODIUM CHLORIDE 0.9 % IV SOLN: INTRAVENOUS | Status: DC

## 2022-10-15 MED ORDER — CIPROFLOXACIN IN D5W 400 MG/200ML IV SOLN
INTRAVENOUS | Status: AC
  Filled 2022-10-15: qty 200

## 2022-10-15 MED ORDER — CIPROFLOXACIN IN D5W 400 MG/200ML IV SOLN
INTRAVENOUS | Status: DC | PRN
  Administered 2022-10-15: 400 mg via INTRAVENOUS

## 2022-10-15 MED ORDER — EPHEDRINE SULFATE-NACL 50-0.9 MG/10ML-% IV SOSY
PREFILLED_SYRINGE | INTRAVENOUS | Status: DC | PRN
  Administered 2022-10-15: 5 mg via INTRAVENOUS

## 2022-10-15 MED ORDER — LIDOCAINE 2% (20 MG/ML) 5 ML SYRINGE
INTRAMUSCULAR | Status: DC | PRN
  Administered 2022-10-15: 70 mg via INTRAVENOUS

## 2022-10-15 MED ORDER — EPINEPHRINE 1 MG/10ML IJ SOSY
PREFILLED_SYRINGE | INTRAMUSCULAR | Status: AC
  Filled 2022-10-15: qty 10

## 2022-10-15 MED ORDER — PROPOFOL 10 MG/ML IV BOLUS
INTRAVENOUS | Status: DC | PRN
  Administered 2022-10-15 (×2): 10 mg via INTRAVENOUS
  Administered 2022-10-15: 30 mg via INTRAVENOUS

## 2022-10-15 MED ORDER — SODIUM CHLORIDE (PF) 0.9 % IJ SOLN
PREFILLED_SYRINGE | INTRAMUSCULAR | Status: DC | PRN
  Administered 2022-10-15: 1 mL
  Administered 2022-10-15: 2.5 mL

## 2022-10-15 MED ORDER — PROPOFOL 500 MG/50ML IV EMUL
INTRAVENOUS | Status: DC | PRN
  Administered 2022-10-15: 125 ug/kg/min via INTRAVENOUS

## 2022-10-15 MED ORDER — PHENYLEPHRINE 80 MCG/ML (10ML) SYRINGE FOR IV PUSH (FOR BLOOD PRESSURE SUPPORT)
PREFILLED_SYRINGE | INTRAVENOUS | Status: DC | PRN
  Administered 2022-10-15: 80 ug via INTRAVENOUS
  Administered 2022-10-15 (×2): 160 ug via INTRAVENOUS
  Administered 2022-10-15: 80 ug via INTRAVENOUS

## 2022-10-15 MED ORDER — MELOXICAM 15 MG PO TABS: 15.0000 mg | ORAL_TABLET | Freq: Every morning | ORAL | 6 refills | Status: DC

## 2022-10-15 SURGICAL SUPPLY — 22 items

## 2022-10-15 NOTE — Transfer of Care (Signed)
Immediate Anesthesia Transfer of Care Note  Patient: Katherine Todd  Procedure(s) Performed: COLONOSCOPY WITH PROPOFOL ENDOSCOPIC MUCOSAL RESECTION POLYPECTOMY LOWER ENDOSCOPIC ULTRASOUND (EUS) HEMOSTASIS CONTROL HEMOSTASIS CLIP PLACEMENT  Patient Location: PACU  Anesthesia Type:MAC  Level of Consciousness: sedated  Airway & Oxygen Therapy: Patient Spontanous Breathing and Patient connected to face mask oxygen  Post-op Assessment: Report given to RN and Post -op Vital signs reviewed and stable  Post vital signs: Reviewed and stable  Last Vitals:  Vitals Value Taken Time  BP    Temp    Pulse    Resp    SpO2      Last Pain:  Vitals:   10/15/22 1042  TempSrc: Temporal  PainSc: 0-No pain         Complications: No notable events documented.

## 2022-10-15 NOTE — Discharge Instructions (Signed)
YOU HAD AN ENDOSCOPIC PROCEDURE TODAY: Refer to the procedure report and other information in the discharge instructions given to you for any specific questions about what was found during the examination. If this information does not answer your questions, please call [facility] office at [phone] to clarify.  ° °YOU SHOULD EXPECT: Some feelings of bloating in the abdomen. Passage of more gas than usual. Walking can help get rid of the air that was put into your GI tract during the procedure and reduce the bloating. If you had a lower endoscopy (such as a colonoscopy or flexible sigmoidoscopy) you may notice spotting of blood in your stool or on the toilet paper. Some abdominal soreness may be present for a day or two, also. ° °DIET: Your first meal following the procedure should be a light meal and then it is ok to progress to your normal diet. A half-sandwich or bowl of soup is an example of a good first meal. Heavy or fried foods are harder to digest and may make you feel nauseous or bloated. Drink plenty of fluids but you should avoid alcoholic beverages for 24 hours. If you had a esophageal dilation, please see attached instructions for diet.   ° °ACTIVITY: Your care partner should take you home directly after the procedure. You should plan to take it easy, moving slowly for the rest of the day. You can resume normal activity the day after the procedure however YOU SHOULD NOT DRIVE, use power tools, machinery or perform tasks that involve climbing or major physical exertion for 24 hours (because of the sedation medicines used during the test).  ° °SYMPTOMS TO REPORT IMMEDIATELY: °A gastroenterologist can be reached at any hour. Please call [phone]  for any of the following symptoms:  °Following lower endoscopy (colonoscopy, flexible sigmoidoscopy) °Excessive amounts of blood in the stool  °Significant tenderness, worsening of abdominal pains  °Swelling of the abdomen that is new, acute  °Fever of 100° or  higher  °Following upper endoscopy (EGD, EUS, ERCP, esophageal dilation) °Vomiting of blood or coffee ground material  °New, significant abdominal pain  °New, significant chest pain or pain under the shoulder blades  °Painful or persistently difficult swallowing  °New shortness of breath  °Black, tarry-looking or red, bloody stools ° °FOLLOW UP:  °If any biopsies were taken you will be contacted by phone or by letter within the next 1-3 weeks. Call [phone]  if you have not heard about the biopsies in 3 weeks.  °Please also call with any specific questions about appointments or follow up tests. ° °

## 2022-10-15 NOTE — Anesthesia Postprocedure Evaluation (Signed)
Anesthesia Post Note  Patient: LENORE MOYANO  Procedure(s) Performed: COLONOSCOPY WITH PROPOFOL ENDOSCOPIC MUCOSAL RESECTION POLYPECTOMY LOWER ENDOSCOPIC ULTRASOUND (EUS) HEMOSTASIS CONTROL HEMOSTASIS CLIP PLACEMENT     Patient location during evaluation: PACU Anesthesia Type: MAC Level of consciousness: awake and alert Pain management: pain level controlled Vital Signs Assessment: post-procedure vital signs reviewed and stable Respiratory status: spontaneous breathing, nonlabored ventilation, respiratory function stable and patient connected to nasal cannula oxygen Cardiovascular status: stable and blood pressure returned to baseline Postop Assessment: no apparent nausea or vomiting Anesthetic complications: no   No notable events documented.  Last Vitals:  Vitals:   10/15/22 1320 10/15/22 1330  BP: (!) 90/52 119/71  Pulse: 73 71  Resp: 15 10  Temp:    SpO2: 100% 100%    Last Pain:  Vitals:   10/15/22 1330  TempSrc:   PainSc: 0-No pain                 Renny Gunnarson

## 2022-10-15 NOTE — Op Note (Addendum)
[facility] Patient Name: Katherine Todd Procedure Date: 10/15/2022 MRN: [id number] Attending MD: Justice Britain , MD, [id number] Date of Birth: 1965/11/10 CSN: [id number] Age: 57 Admit Type: Outpatient Procedure:                Lower EUS Indications:              Excision of colonic polyp Providers:                Justice Britain, MD, Carlyn Reichert, RN, [facility] Technician, Technician, Frazier Richards,                            Technician Referring MD:             Carlota Raspberry. Havery Moros, MD, Ermalene Searing. [name] MD, MD Medicines:                Monitored Anesthesia Care, Cipro 824 mg IV Complications:            No immediate complications. Estimated Blood Loss:     Estimated blood loss was minimal. Procedure:                Pre-Anesthesia Assessment:                           - Prior to the procedure, a History and Physical                            was performed, and patient medications and                            allergies were reviewed. The patient's tolerance of                            previous anesthesia was also reviewed. The risks                            and benefits of the procedure and the sedation                            options and risks were discussed with the patient.                            All questions were answered, and informed consent                            was obtained. Prior Anticoagulants: The patient has                            taken no anticoagulant or antiplatelet agents                            except for NSAID medication. ASA Grade Assessment:  II - A patient with mild systemic disease. After                            reviewing the risks and benefits, the patient was                            deemed in satisfactory condition to undergo the                            procedure.                           After obtaining informed consent, the endoscope was                             passed under direct vision. Throughout the                            procedure, the patient's blood pressure, pulse, and                            oxygen saturations were monitored continuously. The                            CF-HQ190L ([id number]) Olympus colonoscope was                            introduced through the anus and advanced to the 3                            cm into the ileum. After obtaining informed                            consent, the endoscope was passed under direct                            vision. Throughout the procedure, the patient's                            blood pressure, pulse, and oxygen saturations were                            monitored continuously.The colonoscopy was                            technically difficult and complex due to a                            redundant colon, significant looping and a tortuous                            colon. Successful completion of the procedure was  aided by changing the patient's position, using                            manual pressure, straightening and shortening the                            scope to obtain bowel loop reduction and using                            scope torsion. The patient tolerated the procedure.                            The quality of the bowel preparation was adequate.                            The terminal ileum, ileocecal valve, appendiceal                            orifice, and rectum were photographed. Scope In: 11:36:43 AM Scope Out: 1:11:07 PM Scope Withdrawal Time: 1 hour 25 minutes 37 seconds  Total Procedure Duration: 1 hour 34 minutes 24 seconds  Findings:      The digital rectal exam findings include hemorrhoids. Pertinent       negatives include no palpable rectal lesions.      ENDOSCOPIC FINDING: :      The colon (entire examined portion) revealed grossly excessive looping.      Three sessile polyps were found in the  hepatic flexure (1) and ascending       colon (2). The polyps were 3 to 4 mm in size. The polyp was removed with       a cold snare. Resection and retrieval were complete.      A 65 mm polypoid lesion was found in the proximal descending colon. The       lesion was pedunculated. No bleeding was present. After the EUS was       completed and did not show evidence of muscularis propria involvement,       preparations were made for mucosal resection. Demarcation of the lesion       was performed with high-definition white light and narrow band imaging       to clearly identify the boundaries of the lesion. A 1:10000 solution of       epinephrine was injected into the lesion (total of 3 mL). Polyloop       placement x 2 was performed. Subsequently using a 30 mm snare and       negotiated this above the loops and used Endocut Q (2/2/6) and after       nearly 5 minutes of cutting and coagulation the lesion was able to be       removed via mucosal resection. Resection and retrieval were complete. To       prevent bleeding post-intervention, three hemostatic clips were       successfully placed (MR conditional) however the size of the defect is       too large to close with our largest hemostatic clips. Clip manufacturer:       [employer]. There was no bleeding during, or at the end, of the  procedure. I attempted to place an OVESCO OTS see but due to the       significant looping of the patient's colon, I could not negotiate the       therapeutic upper endoscope (we only had 11/6t) to the area to try to       place this. Thus decision made to place further poly loops for cinching.       I placed a total of 4 poly loops on this lesion base. I then       subsequently used/injected 3 mL PuraStat for additional hemostasis       precautions.      Normal mucosa was found in the entire colon otherwise.      Non-bleeding non-thrombosed external and internal hemorrhoids were found       during  retroflexion, during perianal exam and during digital exam. The       hemorrhoids were Grade II (internal hemorrhoids that prolapse but reduce       spontaneously).      ENDOSONOGRAPHIC FINDING: :      A hypoechoic lesion was found in the descending colon. The       endosonographic borders were smooth. The mass measured 60 mm (in maximum       length) by 26 mm (in maximum width). There was no sonographic evidence       of invasion into the muscularis propria (Layer 4) while looking at the       base. This does not have the typical appearance of a lipoma on EUS. Impression:               COLON Impression:                           - Hemorrhoids found on digital rectal exam.                           - There was significant looping of the colon.                           - Three 3 to 4 mm polyps at the hepatic flexure and                            in the ascending colon, removed with a cold snare.                            Resected and retrieved.                           - Polypoid lesion in the proximal descending colon.                            Complete removal was accomplished as noted above.                            Clips (MR conditional) were placed but unsuccessful                            to completely close the defect. 4 total Polyloops  in place for cinching. Injected PuraStat.                           - Normal mucosa in the entire examined colon                            otherwise.                           - Non-bleeding non-thrombosed external and internal                            hemorrhoids.                           EUS Impression:                           - A polypoid lesion was visualized                            endosonographically in the descending colon. A                            tissue diagnosis was obtained prior to this exam.                            This is suggestive of benign inflammatory changes.                            - Mucosal resection was performed. Resection and                            retrieval were complete. Moderate Sedation:      Not Applicable - Patient had care per Anesthesia. Recommendation:           - The patient will be observed post-procedure,                            until all discharge criteria are met.                           - Discharge patient to home.                           - Patient has a contact number available for                            emergencies. The signs and symptoms of potential                            delayed complications were discussed with the                            patient. Return to normal activities tomorrow.  Written discharge instructions were provided to the                            patient.                           - High fiber diet.                           - Use FiberCon 1-2 tablets PO daily.                           - Minimize NSAID use for next 1 to 2 weeks.                           - Await path results.                           - Monitor for signs/symptoms of bleeding,                            perforation, and infection. If issues please call                            our number to get further assistance as needed.                           - The findings and recommendations were discussed                            with the patient.                           - The findings and recommendations were discussed                            with the patient's family. Procedure Code(s):        --- Professional ---                           [phone], Colonoscopy, flexible; with endoscopic                            mucosal resection                           45391, Colonoscopy, flexible; with endoscopic                            ultrasound examination limited to the rectum,                            sigmoid, descending, transverse, or ascending colon                            and cecum, and adjacent  structures  84132, 59, Colonoscopy, flexible; with removal of                            tumor(s), polyp(s), or other lesion(s) by snare                            technique Diagnosis Code(s):        --- Professional ---                           K64.1, Second degree hemorrhoids                           D12.3, Benign neoplasm of transverse colon (hepatic                            flexure or splenic flexure)                           D12.2, Benign neoplasm of ascending colon                           D49.0, Neoplasm of unspecified behavior of                            digestive system                           K63.89, Other specified diseases of intestine                           K63.5, Polyp of colon CPT copyright 2022 American [employer] Association. All rights reserved. The codes documented in this report are preliminary and upon coder review may  be revised to meet current compliance requirements. Justice Britain, MD 10/15/2022 1:42:35 PM Number of Addenda: 0

## 2022-10-15 NOTE — Anesthesia Preprocedure Evaluation (Addendum)
Anesthesia Evaluation  Patient identified by MRN, date of birth, ID band Patient awake    Reviewed: Allergy & Precautions, H&P , NPO status , Patient's Chart, lab work & pertinent test results  Airway Mallampati: I  TM Distance: >3 FB Neck ROM: Full    Dental no notable dental hx. (+) Teeth Intact, Dental Advisory Given, Caps   Pulmonary neg pulmonary ROS, sleep apnea    Pulmonary exam normal breath sounds clear to auscultation       Cardiovascular Exercise Tolerance: Good negative cardio ROS Normal cardiovascular exam Rhythm:Regular Rate:Normal     Neuro/Psych  Headaches PSYCHIATRIC DISORDERS Anxiety Depression     Neuromuscular disease negative neurological ROS  negative psych ROS   GI/Hepatic negative GI ROS, Neg liver ROS,GERD  ,,  Endo/Other  negative endocrine ROS    Renal/GU negative Renal ROS  negative genitourinary   Musculoskeletal negative musculoskeletal ROS (+) Arthritis ,  Fibromyalgia -  Abdominal   Peds negative pediatric ROS (+)  Hematology negative hematology ROS (+) Blood dyscrasia, anemia   Anesthesia Other Findings   Reproductive/Obstetrics negative OB ROS                             Anesthesia Physical Anesthesia Plan  ASA: 2  Anesthesia Plan: MAC   Post-op Pain Management: Minimal or no pain anticipated   Induction: Intravenous  PONV Risk Score and Plan: 3 and Propofol infusion  Airway Management Planned: Simple Face Mask and Natural Airway  Additional Equipment: None  Intra-op Plan:   Post-operative Plan: Extubation in OR  Informed Consent: I have reviewed the patients History and Physical, chart, labs and discussed the procedure including the risks, benefits and alternatives for the proposed anesthesia with the patient or authorized representative who has indicated his/her understanding and acceptance.     Dental advisory given  Plan Discussed  with: CRNA and Anesthesiologist  Anesthesia Plan Comments:        Anesthesia Quick Evaluation

## 2022-10-15 NOTE — Interval H&P Note (Signed)
History and Physical Interval Note:  10/15/2022 11:02 AM  Katherine Todd  has presented today for surgery, with the diagnosis of polyp lesion.  The various methods of treatment have been discussed with the patient and family. After consideration of risks, benefits and other options for treatment, the patient has consented to  Procedure(s): COLONOSCOPY WITH PROPOFOL (N/A) ENDOSCOPIC MUCOSAL RESECTION (N/A) as a surgical intervention.  The patient's history has been reviewed, patient examined, no change in status, stable for surgery.  I have reviewed the patient's chart and labs.  Questions were answered to the patient's satisfaction.     [employer]

## 2022-10-16 ENCOUNTER — Encounter (HOSPITAL_COMMUNITY): Payer: Self-pay

## 2022-10-16 ENCOUNTER — Other Ambulatory Visit (HOSPITAL_COMMUNITY): Payer: Self-pay

## 2022-10-20 ENCOUNTER — Encounter: Payer: Self-pay | Admitting: Gastroenterology

## 2022-10-20 DIAGNOSIS — Z8719 Personal history of other diseases of the digestive system: Secondary | ICD-10-CM

## 2022-10-20 LAB — SURGICAL PATHOLOGY

## 2022-10-21 ENCOUNTER — Encounter: Payer: Self-pay | Admitting: Gastroenterology

## 2022-10-21 NOTE — Telephone Encounter (Signed)
CT enterography order in epic. Secure staff message sent to radiology scheduling to contact patient to set up appt. MyChart message sent to patient.

## 2022-10-30 ENCOUNTER — Encounter: Payer: Self-pay | Admitting: Gastroenterology

## 2022-11-04 ENCOUNTER — Encounter (HOSPITAL_COMMUNITY): Payer: Self-pay

## 2022-11-04 ENCOUNTER — Ambulatory Visit (HOSPITAL_COMMUNITY)
Admission: RE | Admit: 2022-11-04 | Discharge: 2022-11-04 | Disposition: A | Discharge status: Home / Self Care | Payer: BC Managed Care – PPO | Source: Ambulatory Visit | Attending: Gastroenterology | Admitting: Gastroenterology

## 2022-11-04 DIAGNOSIS — Z8719 Personal history of other diseases of the digestive system: Secondary | ICD-10-CM | POA: Diagnosis not present

## 2022-11-04 MED ORDER — IOHEXOL 300 MG/ML  SOLN
100.0000 mL | Freq: Once | INTRAMUSCULAR | Status: AC | PRN
  Administered 2022-11-04: 100 mL via INTRAVENOUS

## 2022-11-04 MED ORDER — SODIUM CHLORIDE (PF) 0.9 % IJ SOLN
INTRAMUSCULAR | Status: AC
  Filled 2022-11-04: qty 50

## 2022-11-10 ENCOUNTER — Other Ambulatory Visit: Payer: Self-pay

## 2022-11-14 ENCOUNTER — Other Ambulatory Visit (HOSPITAL_COMMUNITY): Payer: Self-pay

## 2022-11-16 ENCOUNTER — Other Ambulatory Visit (HOSPITAL_COMMUNITY): Payer: Self-pay

## 2022-11-18 ENCOUNTER — Other Ambulatory Visit (HOSPITAL_COMMUNITY): Payer: Self-pay | Admitting: *Deleted

## 2022-11-20 ENCOUNTER — Ambulatory Visit (HOSPITAL_COMMUNITY)
Admission: RE | Admit: 2022-11-20 | Discharge: 2022-11-20 | Disposition: A | Discharge status: Home / Self Care | Payer: BC Managed Care – PPO | Source: Ambulatory Visit | Attending: Internal Medicine | Admitting: Internal Medicine

## 2022-11-20 DIAGNOSIS — M810 Age-related osteoporosis without current pathological fracture: Secondary | ICD-10-CM | POA: Insufficient documentation

## 2022-11-20 MED ORDER — DENOSUMAB 60 MG/ML ~~LOC~~ SOSY
PREFILLED_SYRINGE | SUBCUTANEOUS | Status: AC
  Filled 2022-11-20: qty 1

## 2022-11-20 MED ORDER — DENOSUMAB 60 MG/ML ~~LOC~~ SOSY
60.0000 mg | PREFILLED_SYRINGE | Freq: Once | SUBCUTANEOUS | Status: AC
  Administered 2022-11-20: 60 mg via SUBCUTANEOUS

## 2022-11-21 ENCOUNTER — Other Ambulatory Visit (HOSPITAL_COMMUNITY): Payer: Self-pay

## 2022-11-23 ENCOUNTER — Other Ambulatory Visit (HOSPITAL_COMMUNITY): Payer: Self-pay

## 2022-11-24 ENCOUNTER — Encounter: Payer: Self-pay | Admitting: Gastroenterology

## 2022-11-25 ENCOUNTER — Other Ambulatory Visit (HOSPITAL_COMMUNITY): Payer: Self-pay

## 2022-11-26 ENCOUNTER — Other Ambulatory Visit (HOSPITAL_COMMUNITY): Payer: Self-pay

## 2022-11-27 ENCOUNTER — Other Ambulatory Visit (HOSPITAL_BASED_OUTPATIENT_CLINIC_OR_DEPARTMENT_OTHER): Payer: Self-pay

## 2022-11-27 ENCOUNTER — Other Ambulatory Visit (HOSPITAL_COMMUNITY): Payer: Self-pay

## 2022-12-11 ENCOUNTER — Other Ambulatory Visit: Payer: Self-pay

## 2022-12-12 ENCOUNTER — Other Ambulatory Visit (HOSPITAL_COMMUNITY): Payer: Self-pay

## 2022-12-14 ENCOUNTER — Other Ambulatory Visit (HOSPITAL_COMMUNITY): Payer: Self-pay

## 2022-12-14 ENCOUNTER — Other Ambulatory Visit (HOSPITAL_BASED_OUTPATIENT_CLINIC_OR_DEPARTMENT_OTHER): Payer: Self-pay

## 2023-01-04 ENCOUNTER — Other Ambulatory Visit: Payer: BC Managed Care – PPO

## 2023-01-04 ENCOUNTER — Ambulatory Visit (INDEPENDENT_AMBULATORY_CARE_PROVIDER_SITE_OTHER): Payer: BC Managed Care – PPO | Admitting: Gastroenterology

## 2023-01-04 ENCOUNTER — Encounter: Payer: Self-pay | Admitting: Gastroenterology

## 2023-01-04 VITALS — BP 118/72 | HR 70 | Ht 64.0 in | Wt 144.1 lb

## 2023-01-04 DIAGNOSIS — K529 Noninfective gastroenteritis and colitis, unspecified: Secondary | ICD-10-CM | POA: Diagnosis not present

## 2023-01-04 DIAGNOSIS — Z860100 Personal history of colon polyps, unspecified: Secondary | ICD-10-CM

## 2023-01-04 DIAGNOSIS — R110 Nausea: Secondary | ICD-10-CM

## 2023-01-04 DIAGNOSIS — Z9884 Bariatric surgery status: Secondary | ICD-10-CM

## 2023-01-04 DIAGNOSIS — K909 Intestinal malabsorption, unspecified: Secondary | ICD-10-CM

## 2023-01-04 MED ORDER — LOPERAMIDE HCL 2 MG PO TABS: ORAL_TABLET | ORAL | 0 refills | Status: DC

## 2023-01-04 MED ORDER — ONDANSETRON 4 MG PO TBDP: 4.0000 mg | ORAL_TABLET | Freq: Four times a day (QID) | ORAL | 1 refills | Status: DC | PRN

## 2023-01-04 NOTE — Patient Instructions (Signed)
If your blood pressure at your visit was 140/90 or greater, please contact your primary care physician to follow up on this. ______________________________________________________  If you are age 58 or older, your body mass index should be between 23-30. Your Body mass index is 24.74 kg/m. If this is out of the aforementioned range listed, please consider follow up with your Primary Care Provider.  If you are age 59 or younger, your body mass index should be between 19-25. Your Body mass index is 24.74 kg/m. If this is out of the aformentioned range listed, please consider follow up with your Primary Care Provider.  ________________________________________________________  The [facility] providers would like to encourage you to use [facility] to communicate with providers for non-urgent requests or questions.  Due to long hold times on the telephone, sending your provider a message by [facility] may be a faster and more efficient way to get a response.  Please allow 48 business hours for a response.  Please remember that this is for non-urgent requests.  _______________________________________________________  Due to recent changes in healthcare laws, you may see the results of your imaging and laboratory studies on MyChart before your provider has had a chance to review them.  We understand that in some cases there may be results that are confusing or concerning to you. Not all laboratory results come back in the same time frame and the provider may be waiting for multiple results in order to interpret others.  Please give [place] 48 hours in order for your provider to thoroughly review all the results before contacting the office for clarification of your results.   Stop taking Fiber and all NSAIDs (including Meloxicam (Mobic).  Please purchase the following medications over the counter and take as directed: Imodium: Take 2 tablets daily at bedtime and 1 tablet each morning.  We have sent the following  medications to your pharmacy for you to pick up at your convenience: Zofran 4 mg ODT: Dissolve 1 tablet orally every 6 hours as needed  Please go to the lab in the basement of our building to have lab work done as you leave today. Hit "B" for basement when you get on the elevator.  When the doors open the lab is on your left.  We will call you with the results. Thank you.  We will be in touch regarding scheduling a colonoscopy at the hospital with Dr. Meridee Score.  Thank you for entrusting me with your care and for choosing [facility], Dr. Ileene Patrick

## 2023-01-04 NOTE — Progress Notes (Signed)
HPI :  57 year old female with a history of obesity status post mini gastric bypass, history of colon polyps, chronic diarrhea, here for a follow-up visit regarding colon polyps and persistent diarrhea.  On her most recent colonoscopy with me done on November 2023, she was found to have a very large pedunculated lesion in the descending colon that on biopsies returned as an inflammatory polyp. CT was performed was performed that showed an intraluminal mass but no evidence of any other significant abnormality.  I referred her to Dr. Meridee Score for removal of this given the size of it.  He did an EUS and then removed it on February 1.  This was a very large lesion, greater than 6 cm in size.  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  She had a follow-up CT enterography done given reported history of intussusception in the past, this did not show any concerning small bowel lesions but they could see the site of the polypectomy with potentially some residual polyp there.  Dr. Meridee Score recommended a repeat colonoscopy in 6 to 12 months after her initial exam.  We discussed these findings today.  The other issue she wanted to discuss was chronic loose stools and concern for fat in her stool.  She states she had a "mini gastric bypass" in 2008.  She states since that time she has seen "orange oil in her stool".  Stools sometimes float.  She goes anywhere from 5-8 times per day, has some nocturnal stools.  She has some gas and bloating with this that bothers her.  She tried Creon remotely which did not help.  She tried cholestyramine in the past which did not help.  More recently she is taking some Imodium for some nocturnal symptoms, 1 dose per day but has not really helped too much.  She tried some FiberCon which has not really helped.  She states she has a hard time maintaining her vitamin D levels.  Of note she is currently on Mounjaro, states  she is been on this for weight loss for the past 1.25 years, has lost about 80 pounds from it.  Her current weight is 144 pounds, BMI 24.7.  She states she would like to stay on this right now.  She states her diarrhea has been stable over years without worsening.  Of note on review of her medication list she has been taking meloxicam every day for arthritis pain.  She denies other NSAID use.    GI Procedures and Studies  November 2023 colonoscopy - Two 3 to 4 mm polyps in the ascending colon, removed with a cold snare. Resected and retrieved. - One 5 mm polyp at the hepatic flexure, removed with a cold snare. Resected and retrieved. - One large polypoid lesion in the descending colon as outlined. This may be a benign / inflamed lipomatous lesion, will await biopsy results. Not removed today given bleeding risk. Tattooed. - Internal hemorrhoids. - Tortuous colon with significant looping.    Colonoscopy 10/15/22 - Dr. Meridee Score - large 65mm lesion removed via EMR following EUS, 2 other small polyps removed.  FINAL MICROSCOPIC DIAGNOSIS:   A. COLON, ASCENDING, HEPATIC FLEXURE, POLYPECTOMY:  Sessile serrated adenoma (s) without cytologic dysplasia.   B. COLON, DESCENDING, POLYPECTOMY:  Polypoid benign spindle cell proliferation with myxoid stroma.  Negative for dysplasia or malignancy.  See comment.   COMMENT:  The ascending polyp shows a submucosal spindle cell proliferation with  myxoid stroma interspersed with delicate thin-walled vessels.  The  spindle cells do not show significant atypia or mitotic activity and  there is no necrosis.  This is a very rare lesion in the colon with very  few case reports in the literature some of which use the term plexiform  fibromyxoma and others use the term be     Imaging Studies  December 2023 CT abdomen pelvis with contrast IMPRESSION: 5.6 cm low-attenuation intraluminal mass in proximal descending colon, without internal fat or other  specific characteristics. Mucinous neoplasm and malignancy cannot be excluded. Short-segment small bowel intussusception in the pelvis. These are typically idiopathic and self-limited. Consider short-term imaging follow-up with CT enterography. No evidence of metastatic disease within the abdomen or pelvis.   CT enterography 11/04/22: IMPRESSION: Previously seen small bowel intussusception in the pelvis is no longer visualized.   Decreased size of 3.4 cm intraluminal mass along the medial wall of the proximal descending colon. This may be due to partial resection; recommend correlation with recent procedure history.   No evidence of abdominal or pelvic metastatic disease.   Large stool burden noted; recommend correlation for symptoms or signs of constipation.    Past Medical History:  Diagnosis Date   Allergy    Anemia    Anxiety    Arthritis    Cataract    Depression    Fibrocystic breast    Fibromyalgia    11/26/16 'resolved'   GERD (gastroesophageal reflux disease)    Hyperlipidemia    Migraines    Obesity    Osteoporosis    Sleep apnea    not currently   Thyroid disease      Past Surgical History:  Procedure Laterality Date   COLONOSCOPY WITH PROPOFOL N/A 10/15/2022   Procedure: COLONOSCOPY WITH PROPOFOL;  Surgeon: Lemar Lofty., MD;  Location: [facility];  Service: Gastroenterology;  Laterality: N/A;   ENDOSCOPIC MUCOSAL RESECTION N/A 10/15/2022   Procedure: ENDOSCOPIC MUCOSAL RESECTION;  Surgeon: Meridee Score Netty Starring., MD;  Location: [facility];  Service: Gastroenterology;  Laterality: N/A;   EUS N/A 10/15/2022   Procedure: LOWER ENDOSCOPIC ULTRASOUND (EUS);  Surgeon: Lemar Lofty., MD;  Location: [facility];  Service: Gastroenterology;  Laterality: N/A;   GASTRIC BYPASS  04/2007, revision 2009 and 2011   "Mini" gastric bypass   HEMOSTASIS CLIP PLACEMENT  10/15/2022   Procedure: HEMOSTASIS CLIP PLACEMENT;  Surgeon: Lemar Lofty., MD;  Location: [facility];  Service: Gastroenterology;;   HEMOSTASIS CONTROL  10/15/2022   Procedure: HEMOSTASIS CONTROL;  Surgeon: Lemar Lofty., MD;  Location: [facility];  Service: Gastroenterology;;   HERNIA REPAIR  2009   incisional repair from gastsric bypass   LAPAROSCOPIC CHOLECYSTECTOMY  09/2002   Dr. Samuella Cota   POLYPECTOMY  10/15/2022   Procedure: POLYPECTOMY;  Surgeon: Lemar Lofty., MD;  Location: [facility];  Service: Gastroenterology;;   TONSILLECTOMY AND ADENOIDECTOMY  age  67   WISDOM TOOTH EXTRACTION  age 73   Family History  Problem Relation Age of Onset   Hyperlipidemia Mother    Prostate cancer Father 49   Thyroid disease Father    Melanoma Brother 35       back of knee    Stroke Maternal Grandmother    Colon cancer Maternal Grandfather 89       colon caner   Pancreatic cancer Paternal Grandmother            Breast cancer Neg Hx    Esophageal cancer Neg  Hx    Stomach cancer Neg Hx    Rectal cancer Neg Hx    Inflammatory bowel disease Neg Hx    Liver disease Neg Hx    Social History   Tobacco Use   Smoking status: Never   Smokeless tobacco: Never  Vaping Use   Vaping Use: Never used  Substance Use Topics   Alcohol use: No   Drug use: No   Current Outpatient Medications  Medication Sig Dispense Refill   ARIPiprazole (ABILIFY) 5 MG tablet Take 2.5 mg by mouth in the morning.     buPROPion (WELLBUTRIN XL) 300 MG 24 hr tablet Take 300 mg by mouth in the morning.     calcium carbonate (TUMS - DOSED IN MG ELEMENTAL CALCIUM) 500 MG chewable tablet Chew 1 tablet by mouth in the morning.     cetirizine (ZYRTEC) 10 MG tablet Take 10 mg by mouth at bedtime.     Cholecalciferol (VITAMIN D3 PO) Place 1 drop into both eyes in the morning.     clonazePAM (KLONOPIN) 0.5 MG tablet Take 0.5 mg by mouth 2 (two) times daily as needed for anxiety.     CYANOCOBALAMIN PO Take 250 mcg by mouth every evening.     denosumab (PROLIA) 60 MG/ML SOSY  injection Inject 60 mg into the skin every 6 (six) months.     famotidine (PEPCID) 20 MG tablet Take 20 mg by mouth 2 (two) times daily.     ipratropium (ATROVENT) 0.03 % nasal spray Place 2 sprays into both nostrils in the morning.  2   levothyroxine (SYNTHROID) 50 MCG tablet Take 50 mcg by mouth daily before breakfast.     lovastatin (MEVACOR) 20 MG tablet Take 20 mg by mouth every Monday at 6 PM.     meloxicam (MOBIC) 15 MG tablet Take 1 tablet (15 mg total) by mouth in the morning.  6   Multiple Vitamin (MULTIVITAMIN WITH MINERALS) TABS tablet Take 1 tablet by mouth in the morning and at bedtime.     Multiple Vitamins-Minerals (OCUVITE EXTRA PO) Take 1 tablet by mouth at bedtime.     Omega-3 Fatty Acids (FISH OIL) 1000 MG CAPS Take 1,000 mg by mouth in the morning and at bedtime.     propranolol (INDERAL) 10 MG tablet Take 1 tablet by mouth daily as needed (difficulty driving over bridges).  12   SUMAtriptan (IMITREX) 100 MG tablet TAKE 1 TABLET BY MOUTH ONCE AS NEEDED FOR MIGRAINE(MAY REPEAT IN 2 HORUS. MAX OF 2 TABLETS PER 24 HOURS)     tirzepatide (MOUNJARO) 2.5 MG/0.5ML Pen Inject 2.5 mg into the skin once a week. 2 mL 5   tirzepatide (MOUNJARO) 5 MG/0.5ML Pen Inject 5 mg into the skin once a week. (Patient taking differently: Inject 5 mg into the skin every Saturday.) 2 mL 11   triamterene-hydrochlorothiazide (MAXZIDE-25) 37.5-25 MG tablet Take 1 tablet by mouth every morning.     Vitamin D, Ergocalciferol, (DRISDOL) 50000 UNITS CAPS Take 50,000 Units by mouth See admin instructions. Take 1 capsule twice daily on Mondays through Fridays.     vitamin E 180 MG (400 UNITS) capsule Take 400 Units by mouth at bedtime.     vortioxetine HBr (TRINTELLIX) 20 MG TABS tablet Take 20 mg by mouth in the morning.     cholestyramine (QUESTRAN) 4 g packet Take 1 packet (4 g total) by mouth daily. 30 each 4   metoCLOPramide (REGLAN) 5 MG tablet Take 1 tablet by mouth 30  min prior to drinking preparation  for colonoscopy. 2 tablet 0   No current facility-administered medications for this visit.   Allergies  Allergen Reactions   [employer] Diphedryl [Diphenhydramine] Other (See Comments)    Heart palpitations  Heart palpitations    Atorvastatin Other (See Comments)    Lipitor= Muscle aches   Adhesive [Tape] Rash     Review of Systems: All systems reviewed and negative except where noted in HPI.   Lab Results  Component Value Date   WBC 7.6 08/12/2022   HGB 13.4 08/12/2022   HCT 40.0 08/12/2022   MCV 93.9 08/12/2022   PLT 263.0 08/12/2022    Lab Results  Component Value Date   CREATININE 0.73 09/22/2022   BUN 11 09/22/2022   NA 139 09/22/2022   K 4.8 09/22/2022   CL 104 09/22/2022   CO2 30 09/22/2022    No results found for: "ALT", "AST", "GGT", "ALKPHOS", "BILITOT"   Physical Exam: BP 118/72 (BP Location: Left Arm, Patient Position: Sitting, Cuff Size: Normal)   Pulse 70   Ht 5\' 4"  (1.626 m)   Wt 144 lb 2 oz (65.4 kg)   LMP 09/14/2010   SpO2 96%   BMI 24.74 kg/m  Constitutional: Pleasant,well-developed, female in no acute distress. Neurological: Alert and oriented to person place and time. Psychiatric: Normal mood and affect. Behavior is normal.   ASSESSMENT: 57 y.o. female here for assessment of the following  1. Chronic diarrhea   2. Intestinal malabsorption, unspecified type   3. History of colon polyps   4. History of gastric bypass   5. Nausea without vomiting    Chronic diarrhea status post mini gastric bypass in 2008.  Clinically it sounds like she has stearrhea but remotely did not respond to Creon dosing.  Has not responded to trial of cholestyramine in the past and fiber supplement.  Currently using low-dose Imodium which has not really helped too much yet.  Of note she takes NSAIDs routinely (meloxicam).  Given her chronic diarrhea, history of gastric bypass and at risk for marginal ulcers, I do not think chronic NSAIDs are good for her and  recommend she avoid routine use of them.  Okay to use as needed but not routinely, high risk for GI bleeding.  She will discuss this with her primary care.  It sounds like her chronic diarrhea is very likely malabsorption from her history of mini gastric bypass.  On review of literature this actually may occur more frequently in mini gastric bypass compared to traditional Roux-en-Y gastric bypass.  Will check a pancreatic fecal elastase, consider trial of higher dose pancreatic enzyme supplementation.  Will screen for celiac disease but that would seem very unlikely.  In the interim recommend she take 2 tabs of Imodium nightly, 1 tab every morning and titrate up or down as needed.  She has not tried higher dose Imodium will see if that helps.  She claims that [facility] has not altered her bowels at all, counseled her that can cause chronic loose stools.  Ultimately it depends on how much this bothers her.  If the diarrhea is really affecting her quality of life, the mini gastric bypass can be reversed which what I think would really help her diarrhea.  Will await lab workup initially.  Removing her colon polyp made no difference in her symptoms.  For surveillance we will have her next colonoscopy in August timeframe with Dr. Meridee Score, will reach out to him and his staff for scheduling.  PLAN: - stop fiber - not helping - stop all NSAIDs - meloxicam, at risk for GI bleeding, can cause chronic diarrhea - lab for IgA, and TTG IgA - lab for fecal pancreatic elastase - start immodium 2 tabs q HS and 1 tab q AM and titrate up or down PRN - claims mounajaro is not related to loose stools but causing nausea, defer to PCP about time frame, give Zofran to use PRN - consideration for pancreatic enzymes pending labs - if symptoms persist and really bother her, consideration for reversing minigastric bypass - surveillance colonoscopy to be scheduled at hospital with Dr. Meridee Score - August time frame will talk  with him about scheduling  Harlin Rain, MD [facility]

## 2023-01-05 ENCOUNTER — Other Ambulatory Visit: Payer: BC Managed Care – PPO

## 2023-01-05 DIAGNOSIS — K529 Noninfective gastroenteritis and colitis, unspecified: Secondary | ICD-10-CM

## 2023-01-05 DIAGNOSIS — K909 Intestinal malabsorption, unspecified: Secondary | ICD-10-CM

## 2023-01-05 DIAGNOSIS — R110 Nausea: Secondary | ICD-10-CM

## 2023-01-05 DIAGNOSIS — Z86010 Personal history of colonic polyps: Secondary | ICD-10-CM

## 2023-01-05 DIAGNOSIS — Z9884 Bariatric surgery status: Secondary | ICD-10-CM

## 2023-01-05 LAB — IGA: Immunoglobulin A: 98 mg/dL (ref 47–310)

## 2023-01-05 LAB — TISSUE TRANSGLUTAMINASE, IGA: (tTG) Ab, IgA: <1 U/mL

## 2023-01-09 ENCOUNTER — Other Ambulatory Visit (HOSPITAL_COMMUNITY): Payer: Self-pay

## 2023-01-09 IMAGING — MG MM DIGITAL SCREENING BILAT W/ TOMO AND CAD
8 series · 8 of 24 positions shown · non-contrast
Comparison: Previous exam(s).

CLINICAL DATA: Screening.

EXAM:
DIGITAL SCREENING BILATERAL MAMMOGRAM WITH TOMOSYNTHESIS AND CAD
TECHNIQUE: Bilateral screening digital craniocaudal and mediolateral oblique
mammograms were obtained. Bilateral screening digital breast
tomosynthesis was performed. The images were evaluated with
computer-aided detection.

[L MLO synth-2D]
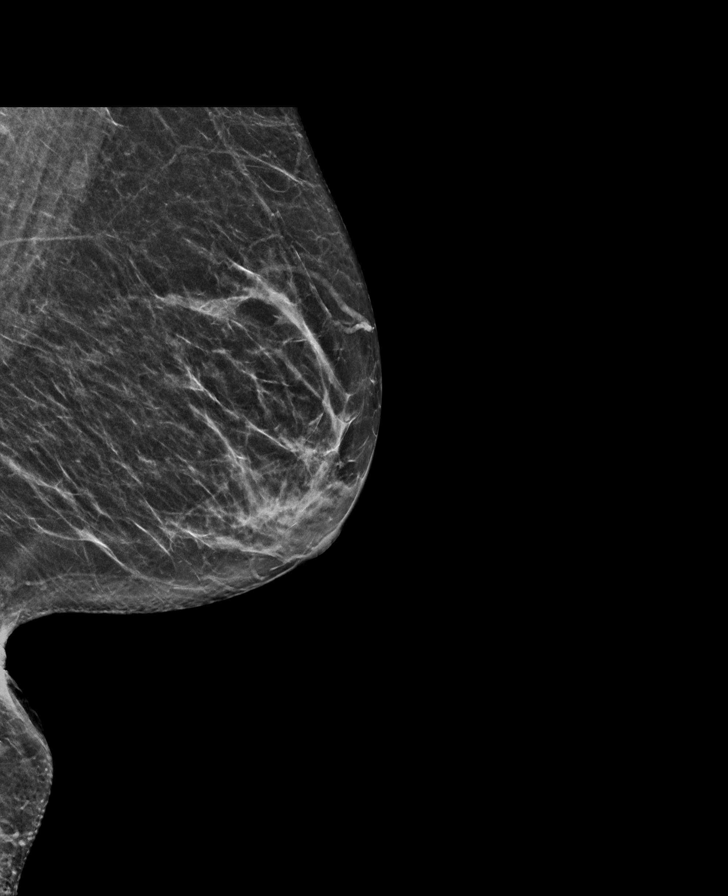

[R MLO synth-2D]
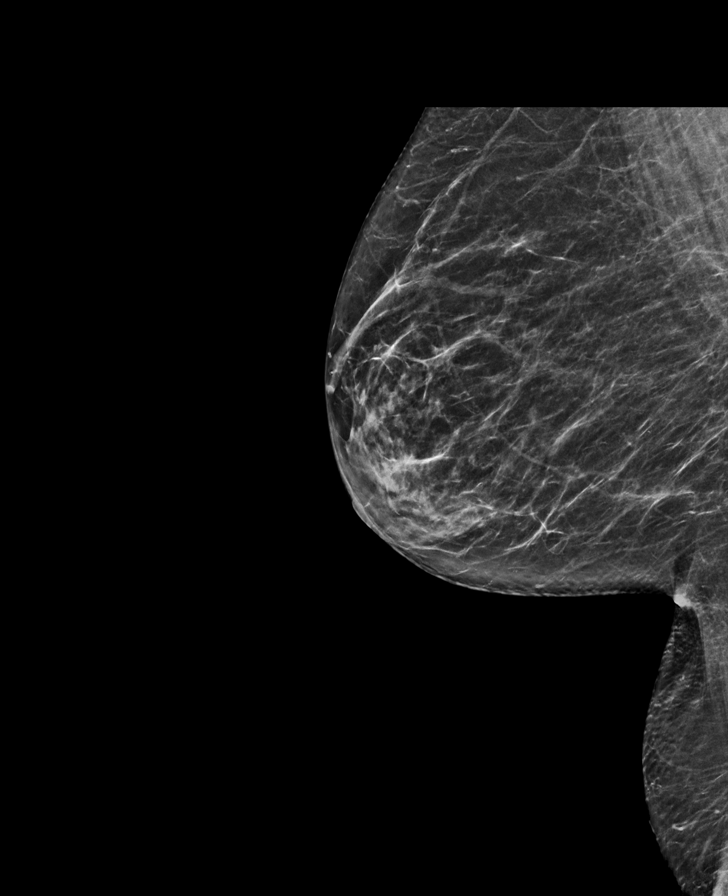

[L CC synth-2D]
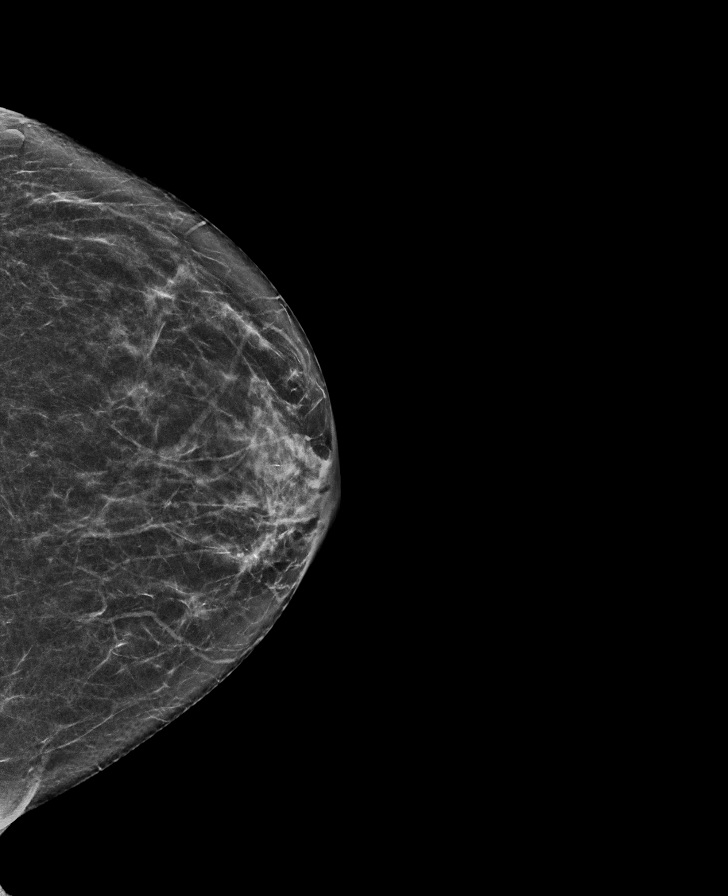

[R CC synth-2D]
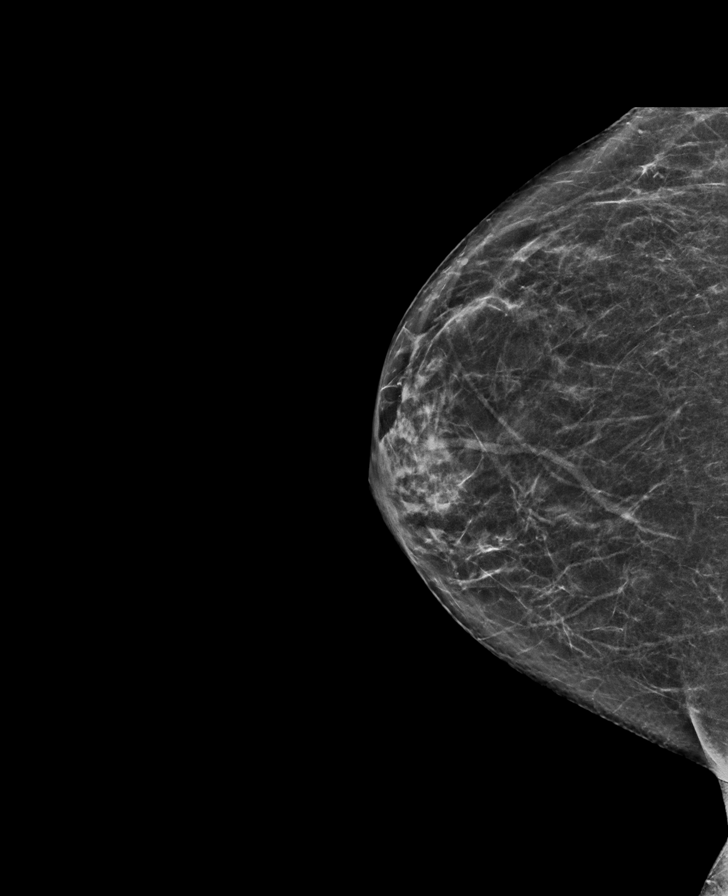

[R CC tomo · tomo slice 34/67.0]
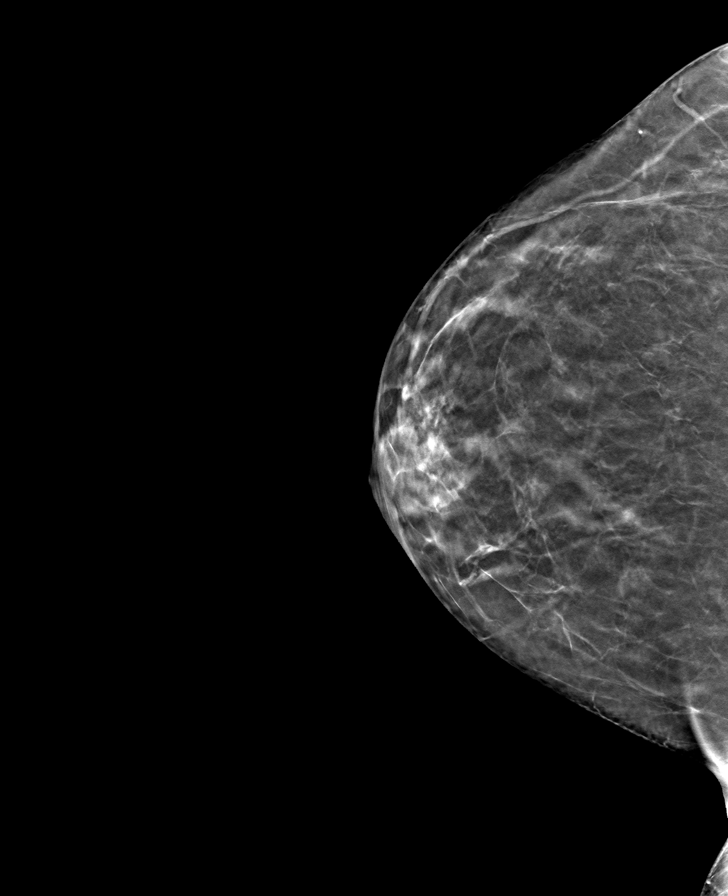

[L MLO tomo · tomo slice 33/66.0]
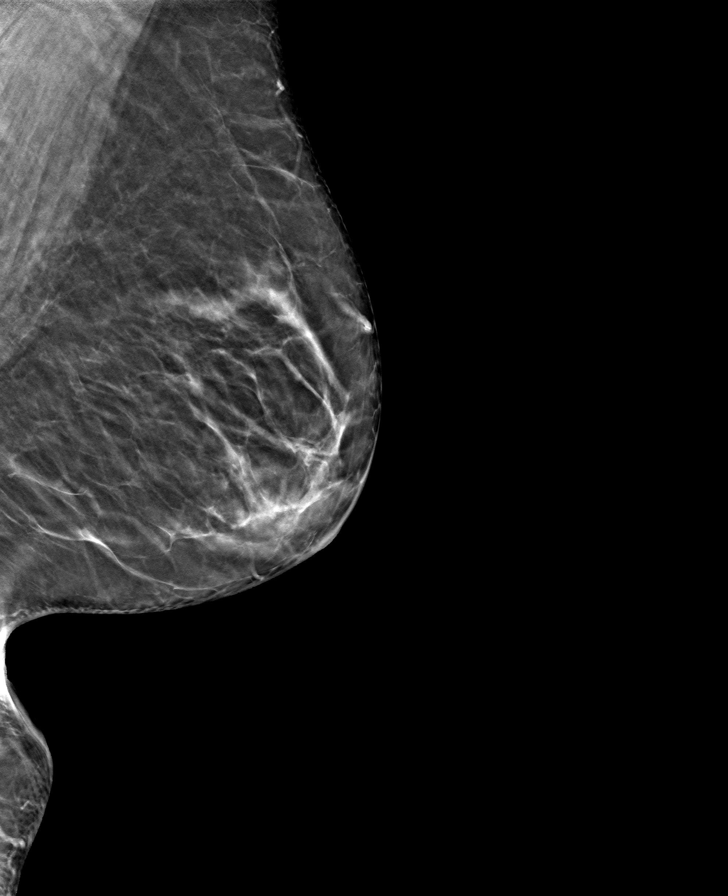

[L CC tomo · tomo slice 32/63.0]
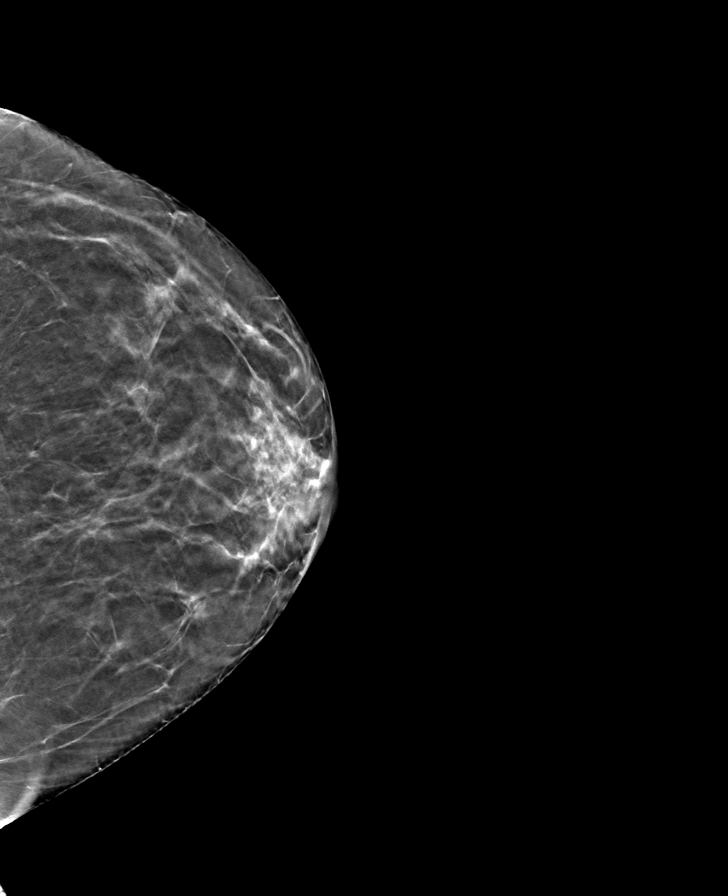

[R MLO tomo · tomo slice 35/68.0]
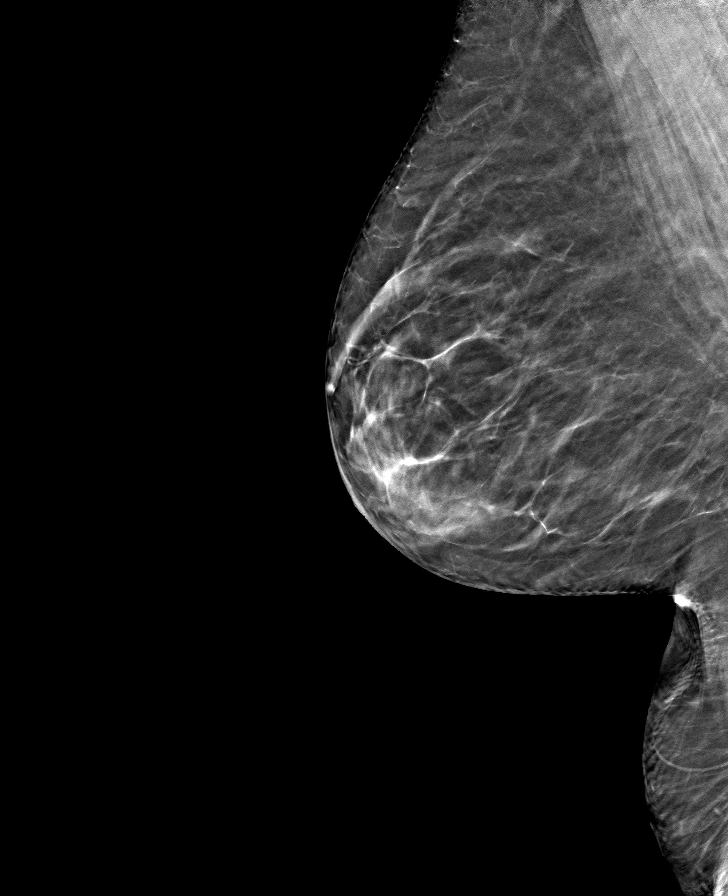

[8 of 24 positions shown; findings below may reference images not displayed]

ACR Breast Density Category b: There are scattered areas of
fibroglandular density.
FINDINGS: There are no findings suspicious for malignancy.
IMPRESSION: No mammographic evidence of malignancy. A result letter of this
screening mammogram will be mailed directly to the patient.

RECOMMENDATION:
Screening mammogram in one year. (Code:51-O-LD2)

BI-RADS CATEGORY  1: Negative.

## 2023-01-11 ENCOUNTER — Other Ambulatory Visit (HOSPITAL_COMMUNITY): Payer: Self-pay

## 2023-01-12 ENCOUNTER — Other Ambulatory Visit: Payer: Self-pay

## 2023-01-12 ENCOUNTER — Other Ambulatory Visit (HOSPITAL_COMMUNITY): Payer: Self-pay

## 2023-01-12 LAB — PANCREATIC ELASTASE, FECAL: Pancreatic Elastase-1, Stool: 321 mcg/g

## 2023-01-13 ENCOUNTER — Encounter: Payer: Self-pay | Admitting: Gastroenterology

## 2023-01-14 ENCOUNTER — Telehealth: Payer: Self-pay

## 2023-01-14 NOTE — Telephone Encounter (Signed)
Armbruster, Willaim Rayas, MD  Loretha Stapler, RN [facility] Schear, Can you help coordinate follow up colonoscopy with Dr. Meridee Score for this patient at the hospital. She is due in August. Thanks  Dr. Mervyn Skeeters

## 2023-01-15 ENCOUNTER — Encounter: Payer: Self-pay | Admitting: Gastroenterology

## 2023-01-15 ENCOUNTER — Other Ambulatory Visit: Payer: Self-pay

## 2023-01-15 DIAGNOSIS — Z86010 Personal history of colonic polyps: Secondary | ICD-10-CM

## 2023-01-15 MED ORDER — NA SULFATE-K SULFATE-MG SULF 17.5-3.13-1.6 GM/177ML PO SOLN: 1.0000 | Freq: Once | ORAL | 0 refills | Status: AC

## 2023-01-15 NOTE — Telephone Encounter (Signed)
Colon scheduled, pt instructed and medications reviewed.  Patient instructions mailed to home.  Patient to call with any questions or concerns.  

## 2023-01-15 NOTE — Telephone Encounter (Signed)
Colon has been scheduled for 04/15/23 at 9 am with GM at [facility]   Left message on machine to call back

## 2023-02-05 ENCOUNTER — Other Ambulatory Visit (HOSPITAL_COMMUNITY): Payer: Self-pay

## 2023-02-25 ENCOUNTER — Other Ambulatory Visit (HOSPITAL_COMMUNITY): Payer: Self-pay

## 2023-02-25 MED ORDER — MOUNJARO 7.5 MG/0.5ML ~~LOC~~ SOAJ
7.5000 mg | SUBCUTANEOUS | 3 refills | Status: DC
  Filled 2023-02-25: qty 2, 28d supply, fill #0
  Filled 2023-03-30 – 2023-04-02 (×2): qty 2, 28d supply, fill #1
  Filled 2023-05-01 (×2): qty 2, 28d supply, fill #2
  Filled 2023-05-28: qty 2, 28d supply, fill #3

## 2023-03-01 ENCOUNTER — Other Ambulatory Visit (HOSPITAL_COMMUNITY): Payer: Self-pay

## 2023-03-03 ENCOUNTER — Other Ambulatory Visit: Payer: Self-pay

## 2023-03-03 DIAGNOSIS — R1013 Epigastric pain: Secondary | ICD-10-CM

## 2023-03-03 NOTE — Telephone Encounter (Signed)
Dr Meridee Score is it ok to add EGD to the pt colon ?

## 2023-03-03 NOTE — Telephone Encounter (Signed)
EGD has been added. Amb referral added. Pt aware

## 2023-03-03 NOTE — Telephone Encounter (Signed)
If we have time I am OK with this, otherwise, will need to be done by Dr. Adela Lank her primary. GM

## 2023-03-03 NOTE — Telephone Encounter (Signed)
Thanks Gabe. If time in your hospital block fine by me, but if not time then we can schedule with me at the [facility]. Thanks

## 2023-03-03 NOTE — Telephone Encounter (Signed)
Referral from [facility] requesting patient have EGD when patient has colonoscopy. Referral states patient has been experiencing epigastric pain as well as nausea over the past few weeks.. Please advise.

## 2023-03-09 ENCOUNTER — Other Ambulatory Visit: Payer: Self-pay

## 2023-03-09 MED ORDER — ONDANSETRON 4 MG PO TBDP: 4.0000 mg | ORAL_TABLET | Freq: Four times a day (QID) | ORAL | 1 refills | Status: DC | PRN

## 2023-03-11 ENCOUNTER — Encounter: Payer: Self-pay | Admitting: Gastroenterology

## 2023-03-11 DIAGNOSIS — R1013 Epigastric pain: Secondary | ICD-10-CM

## 2023-03-11 DIAGNOSIS — K529 Noninfective gastroenteritis and colitis, unspecified: Secondary | ICD-10-CM

## 2023-03-11 DIAGNOSIS — R110 Nausea: Secondary | ICD-10-CM

## 2023-03-11 DIAGNOSIS — K909 Intestinal malabsorption, unspecified: Secondary | ICD-10-CM

## 2023-03-12 ENCOUNTER — Other Ambulatory Visit (INDEPENDENT_AMBULATORY_CARE_PROVIDER_SITE_OTHER): Payer: BC Managed Care – PPO

## 2023-03-12 DIAGNOSIS — R1013 Epigastric pain: Secondary | ICD-10-CM

## 2023-03-12 DIAGNOSIS — R110 Nausea: Secondary | ICD-10-CM

## 2023-03-12 DIAGNOSIS — K909 Intestinal malabsorption, unspecified: Secondary | ICD-10-CM

## 2023-03-12 DIAGNOSIS — K529 Noninfective gastroenteritis and colitis, unspecified: Secondary | ICD-10-CM | POA: Diagnosis not present

## 2023-03-12 LAB — HEPATIC FUNCTION PANEL
ALT: 38 U/L — ABNORMAL HIGH (ref 0–35)
AST: 26 U/L (ref 0–37)
Albumin: 3.7 g/dL (ref 3.5–5.2)
Alkaline Phosphatase: 79 U/L (ref 39–117)
Bilirubin, Direct: 0.1 mg/dL (ref 0.0–0.3)
Total Bilirubin: 0.3 mg/dL (ref 0.2–1.2)
Total Protein: 6 g/dL (ref 6.0–8.3)

## 2023-03-12 LAB — CBC WITH DIFFERENTIAL/PLATELET
Basophils Absolute: 0 10*3/uL (ref 0.0–0.1)
Basophils Relative: 0.6 % (ref 0.0–3.0)
Eosinophils Absolute: 0.1 10*3/uL (ref 0.0–0.7)
Eosinophils Relative: 1.8 % (ref 0.0–5.0)
HCT: 39.7 % (ref 36.0–46.0)
Hemoglobin: 13.1 g/dL (ref 12.0–15.0)
Lymphocytes Relative: 22.5 % (ref 12.0–46.0)
Lymphs Abs: 1.7 10*3/uL (ref 0.7–4.0)
MCHC: 33.1 g/dL (ref 30.0–36.0)
MCV: 96.9 fl (ref 78.0–100.0)
Monocytes Absolute: 0.6 10*3/uL (ref 0.1–1.0)
Monocytes Relative: 8.2 % (ref 3.0–12.0)
Neutro Abs: 5 10*3/uL (ref 1.4–7.7)
Neutrophils Relative %: 66.9 % (ref 43.0–77.0)
Platelets: 243 10*3/uL (ref 150.0–400.0)
RBC: 4.1 Mil/uL (ref 3.87–5.11)
RDW: 13.5 % (ref 11.5–15.5)
WBC: 7.4 10*3/uL (ref 4.0–10.5)

## 2023-03-12 LAB — LIPASE: Lipase: 40 U/L (ref 11.0–59.0)

## 2023-03-12 MED ORDER — SUCRALFATE 1 G PO TABS: 1.0000 g | ORAL_TABLET | Freq: Four times a day (QID) | ORAL | 0 refills | Status: DC

## 2023-03-14 ENCOUNTER — Encounter: Payer: Self-pay | Admitting: Gastroenterology

## 2023-04-02 ENCOUNTER — Other Ambulatory Visit: Payer: Self-pay

## 2023-04-02 ENCOUNTER — Other Ambulatory Visit (HOSPITAL_COMMUNITY): Payer: Self-pay

## 2023-04-02 ENCOUNTER — Encounter (HOSPITAL_COMMUNITY): Payer: Self-pay

## 2023-04-03 ENCOUNTER — Other Ambulatory Visit (HOSPITAL_COMMUNITY): Payer: Self-pay

## 2023-04-05 ENCOUNTER — Other Ambulatory Visit (HOSPITAL_COMMUNITY): Payer: Self-pay

## 2023-04-06 ENCOUNTER — Encounter (HOSPITAL_COMMUNITY): Payer: Self-pay | Admitting: Gastroenterology

## 2023-04-07 ENCOUNTER — Encounter: Payer: Self-pay | Admitting: Gastroenterology

## 2023-04-09 ENCOUNTER — Other Ambulatory Visit: Payer: Self-pay | Admitting: Gastroenterology

## 2023-04-09 MED ORDER — SUCRALFATE 1 G PO TABS: 1.0000 g | ORAL_TABLET | Freq: Four times a day (QID) | ORAL | 0 refills | Status: DC

## 2023-04-14 NOTE — Anesthesia Preprocedure Evaluation (Signed)
Anesthesia Evaluation  Patient identified by MRN, date of birth, ID band Patient awake    Reviewed: Allergy & Precautions, NPO status , Patient's Chart, lab work & pertinent test results  Airway Mallampati: II  TM Distance: >3 FB Neck ROM: Full    Dental  (+) Teeth Intact, Dental Advisory Given   Pulmonary sleep apnea    Pulmonary exam normal breath sounds clear to auscultation       Cardiovascular negative cardio ROS Normal cardiovascular exam Rhythm:Regular Rate:Normal     Neuro/Psych  Headaches PSYCHIATRIC DISORDERS Anxiety Depression       GI/Hepatic Neg liver ROS,GERD  Medicated,,colon polyp   Endo/Other  Hypothyroidism    Renal/GU negative Renal ROS     Musculoskeletal  (+) Arthritis ,  Fibromyalgia -  Abdominal   Peds  Hematology negative hematology ROS (+)   Anesthesia Other Findings   Reproductive/Obstetrics                             Anesthesia Physical Anesthesia Plan  ASA: 2  Anesthesia Plan: MAC   Post-op Pain Management: Minimal or no pain anticipated   Induction: Intravenous  PONV Risk Score and Plan: 2 and TIVA and Treatment may vary due to age or medical condition  Airway Management Planned: Natural Airway and Simple Face Mask  Additional Equipment:   Intra-op Plan:   Post-operative Plan:   Informed Consent: I have reviewed the patients History and Physical, chart, labs and discussed the procedure including the risks, benefits and alternatives for the proposed anesthesia with the patient or authorized representative who has indicated his/her understanding and acceptance.     Dental advisory given  Plan Discussed with: CRNA  Anesthesia Plan Comments:        Anesthesia Quick Evaluation

## 2023-04-15 ENCOUNTER — Ambulatory Visit (HOSPITAL_COMMUNITY): Payer: BC Managed Care – PPO | Admitting: Anesthesiology

## 2023-04-15 ENCOUNTER — Encounter (HOSPITAL_COMMUNITY)
Admission: RE | Disposition: A | Discharge status: Home / Self Care | Payer: Self-pay | Source: Home / Self Care | Attending: Gastroenterology

## 2023-04-15 ENCOUNTER — Other Ambulatory Visit: Payer: Self-pay

## 2023-04-15 ENCOUNTER — Encounter (HOSPITAL_COMMUNITY): Payer: Self-pay | Admitting: Gastroenterology

## 2023-04-15 ENCOUNTER — Ambulatory Visit (HOSPITAL_COMMUNITY)
Admission: RE | Admit: 2023-04-15 | Discharge: 2023-04-15 | Disposition: A | Discharge status: Home / Self Care | Payer: BC Managed Care – PPO | Attending: Gastroenterology | Admitting: Gastroenterology

## 2023-04-15 ENCOUNTER — Telehealth: Payer: Self-pay

## 2023-04-15 DIAGNOSIS — K2100 Gastro-esophageal reflux disease with esophagitis, without bleeding: Secondary | ICD-10-CM | POA: Diagnosis not present

## 2023-04-15 DIAGNOSIS — K644 Residual hemorrhoidal skin tags: Secondary | ICD-10-CM | POA: Insufficient documentation

## 2023-04-15 DIAGNOSIS — K639 Disease of intestine, unspecified: Secondary | ICD-10-CM

## 2023-04-15 DIAGNOSIS — Z86010 Personal history of colonic polyps: Secondary | ICD-10-CM

## 2023-04-15 DIAGNOSIS — K641 Second degree hemorrhoids: Secondary | ICD-10-CM | POA: Diagnosis not present

## 2023-04-15 DIAGNOSIS — G4730 Sleep apnea, unspecified: Secondary | ICD-10-CM | POA: Insufficient documentation

## 2023-04-15 DIAGNOSIS — K222 Esophageal obstruction: Secondary | ICD-10-CM

## 2023-04-15 DIAGNOSIS — R12 Heartburn: Secondary | ICD-10-CM | POA: Diagnosis present

## 2023-04-15 DIAGNOSIS — K635 Polyp of colon: Secondary | ICD-10-CM | POA: Diagnosis not present

## 2023-04-15 DIAGNOSIS — Q438 Other specified congenital malformations of intestine: Secondary | ICD-10-CM | POA: Insufficient documentation

## 2023-04-15 DIAGNOSIS — Z9889 Other specified postprocedural states: Secondary | ICD-10-CM | POA: Insufficient documentation

## 2023-04-15 DIAGNOSIS — Z9884 Bariatric surgery status: Secondary | ICD-10-CM | POA: Diagnosis not present

## 2023-04-15 HISTORY — PX: BIOPSY: SHX5522

## 2023-04-15 HISTORY — PX: ESOPHAGOGASTRODUODENOSCOPY (EGD) WITH PROPOFOL: SHX5813

## 2023-04-15 HISTORY — PX: POLYPECTOMY: SHX5525

## 2023-04-15 HISTORY — PX: COLONOSCOPY WITH PROPOFOL: SHX5780

## 2023-04-15 SURGERY — COLONOSCOPY WITH PROPOFOL
Anesthesia: Monitor Anesthesia Care

## 2023-04-15 MED ORDER — PROPOFOL 500 MG/50ML IV EMUL
INTRAVENOUS | Status: DC | PRN
  Administered 2023-04-15: 100 ug/kg/min via INTRAVENOUS

## 2023-04-15 MED ORDER — PROPOFOL 10 MG/ML IV BOLUS
INTRAVENOUS | Status: DC | PRN
  Administered 2023-04-15: 20 mg via INTRAVENOUS
  Administered 2023-04-15: 130 mg via INTRAVENOUS
  Administered 2023-04-15: 70 mg via INTRAVENOUS
  Administered 2023-04-15: 60 mg via INTRAVENOUS
  Administered 2023-04-15: 30 mg via INTRAVENOUS

## 2023-04-15 MED ORDER — LACTATED RINGERS IV SOLN: INTRAVENOUS | Status: DC

## 2023-04-15 MED ORDER — PROPOFOL 10 MG/ML IV BOLUS
INTRAVENOUS | Status: AC
  Filled 2023-04-15: qty 20

## 2023-04-15 MED ORDER — SODIUM CHLORIDE 0.9 % IV SOLN: INTRAVENOUS | Status: DC

## 2023-04-15 MED ORDER — PROPOFOL 1000 MG/100ML IV EMUL
INTRAVENOUS | Status: AC
  Filled 2023-04-15: qty 100

## 2023-04-15 MED ORDER — LIDOCAINE 2% (20 MG/ML) 5 ML SYRINGE
INTRAMUSCULAR | Status: DC | PRN
  Administered 2023-04-15: 60 mg via INTRAVENOUS

## 2023-04-15 SURGICAL SUPPLY — 25 items

## 2023-04-15 NOTE — Discharge Instructions (Signed)

## 2023-04-15 NOTE — Telephone Encounter (Signed)
-----   Message from [facility] sent at 04/15/2023  1:45 PM EDT ----- Regarding: RE: Followup SA, Since you are good with the CT then I will have one of them help put the CT order in place and we will put it under your name but let me know what it ends up showing. Thanks. GM  Rael Yo, This patient needs a CT abdomen/pelvis with IV and oral contrast to follow-up a colonic subepithelial lesion/plexiform fibromyxoma. You may set it up under Dr. Lanetta Inch name. Thanks. GM ----- Message ----- From: Benancio Deeds, MD Sent: 04/15/2023   1:27 PM EDT To: Lemar Lofty., MD Subject: RE: Followup                                   Hey Gabe thanks so much for your help with her. Yes I remember her colon, extremely difficult. Thanks for taking the time to do her case. Do you need me to set up the CT or will Timouthy Gilardi / Rovonda do that? ----- Message ----- From: Lemar Lofty., MD Sent: 04/15/2023  11:14 AM EDT To: Benancio Deeds, MD Subject: Followup                                       SA, Hope you are well. Certainly remember her colon and I will in the future not only for the plexiform fibromyxoma but because of the difficulty of getting all the way to the beginning.  She no longer has any intraluminal component to the lesion.  However she has a subepithelial component that will not be able to be removed endoscopically. I spoke with her and told her that I would likely recommend that we get a CT scan to just evaluate if there is been any change in the size of the lesion and if so, then likely need surgical referral.  If the lesion is stable in size, then we can present at [facility] and then discuss a surgical plan potentially versus monitoring and surveillance. She looks to be in agreement with that. If you are okay then we should go ahead and get her scheduled for a CT scan. Will get the results of her upper endoscopy soon. Let me know. Thanks. GM

## 2023-04-15 NOTE — Op Note (Signed)
[facility] Patient Name: Katherine Todd Procedure Date: 04/15/2023 MRN: [id number] Attending MD: [name] , MD, [id number] Date of Birth: 09/05/1966 CSN: [id number] Age: 57 Admit Type: Outpatient Procedure:                Upper GI endoscopy Indications:              Heartburn, Suspected gastro-esophageal reflux                            disease, Esophageal reflux symptoms that persist                            despite appropriate therapy Providers:                [name], MD, Fransisca Connors, Harrington Challenger, Technician Referring MD:             Willaim Rayas. Adela Lank, MD, Ivery Quale Medicines:                Monitored Anesthesia Care Complications:            No immediate complications. Estimated Blood Loss:     Estimated blood loss was minimal. Procedure:                Pre-Anesthesia Assessment:                           - Prior to the procedure, a History and Physical                            was performed, and patient medications and                            allergies were reviewed. The patient's tolerance of                            previous anesthesia was also reviewed. The risks                            and benefits of the procedure and the sedation                            options and risks were discussed with the patient.                            All questions were answered, and informed consent                            was obtained. Prior Anticoagulants: The patient has                            taken no anticoagulant or antiplatelet agents. ASA  Grade Assessment: III - A patient with severe                            systemic disease. After reviewing the risks and                            benefits, the patient was deemed in satisfactory                            condition to undergo the procedure.                           After obtaining informed consent, the  endoscope was                            passed under direct vision. Throughout the                            procedure, the patient's blood pressure, pulse, and                            oxygen saturations were monitored continuously. The                            [id number]-H190 ([id number]) Olympus endoscope was introduced                            through the mouth, and advanced to the second part                            of duodenum. The upper GI endoscopy was                            accomplished without difficulty. The patient                            tolerated the procedure. Scope In: Scope Out: Findings:      White and yellowish nummular lesions were noted in the entire esophagus.       Biopsies were taken with a cold forceps for histology.      A non-obstructing Schatzki ring was found at the gastroesophageal       junction.      The Z-line was irregular and was found 33 cm from the incisors.      Evidence of a gastric bypass was found. A gastric pouch with a 7 cm       length from the GE junction to the gastrojejunal anastomosis was found.       The gastrojejunal anastomosis was characterized by healthy appearing       mucosa. This was traversed. The pouch-to-jejunum limb was characterized       by healthy appearing mucosa. Biopsies were taken with a cold forceps for       histology.      Normal mucosa was found in the visualized apparent afferent and efferent       jejunum. Biopsies were taken with a cold  forceps for histology. Impression:               - White and yellowish nummular lesions in                            esophageal mucosa. Biopsied.                           - Non-obstructing Schatzki ring.                           - Z-line irregular, 33 cm from the incisors.                           - Gastric bypass with a pouch 7 cm in length.                            Gastrojejunal anastomosis characterized by healthy                            appearing mucosa.  Biopsied.                           - Normal mucosa was found in the apparent afferent                            and efferent jejunum. Biopsied. Moderate Sedation:      Not Applicable - Patient had care per Anesthesia. Recommendation:           - Proceed to scheduled colonoscopy.                           - Continue present medications.                           - Await pathology results.                           - The findings and recommendations were discussed                            with the patient.                           - The findings and recommendations were discussed                            with the patient's family. Procedure Code(s):        --- Professional ---                           [phone], Esophagogastroduodenoscopy, flexible,                            transoral; with biopsy, single or multiple Diagnosis Code(s):        --- Professional ---  K22.89, Other specified disease of esophagus                           K22.2, Esophageal obstruction                           Z98.84, Bariatric surgery status                           R12, Heartburn                           K21.9, Gastro-esophageal reflux disease without                            esophagitis CPT copyright 2022 American Medical Association. All rights reserved. The codes documented in this report are preliminary and upon coder review may  be revised to meet current compliance requirements. [name], MD 04/15/2023 10:24:26 AM Number of Addenda: 0

## 2023-04-15 NOTE — Telephone Encounter (Signed)
CT order entered and sent to the schedulers

## 2023-04-15 NOTE — Op Note (Signed)
[facility] Patient Name: Katherine Todd Procedure Date: 04/15/2023 MRN: [id number] Attending MD: [name] , MD, [id number] Date of Birth: 09-22-1965 CSN: [id number] Age: 57 Admit Type: Outpatient Procedure:                Colonoscopy Indications:              High risk colon cancer surveillance: Personal                            history of colonic polyps Providers:                [name], MD, Fransisca Connors, Harrington Challenger, Technician Referring MD:             Willaim Rayas. Adela Lank, MD, Ivery Quale Medicines:                Monitored Anesthesia Care Complications:            No immediate complications. Estimated Blood Loss:     Estimated blood loss was minimal. Procedure:                Pre-Anesthesia Assessment:                           - Prior to the procedure, a History and Physical                            was performed, and patient medications and                            allergies were reviewed. The patient's tolerance of                            previous anesthesia was also reviewed. The risks                            and benefits of the procedure and the sedation                            options and risks were discussed with the patient.                            All questions were answered, and informed consent                            was obtained. Prior Anticoagulants: The patient has                            taken no anticoagulant or antiplatelet agents. ASA                            Grade Assessment: III - A patient with severe  systemic disease. After reviewing the risks and                            benefits, the patient was deemed in satisfactory                            condition to undergo the procedure.                           After obtaining informed consent, the colonoscope                            was passed under direct vision. Throughout the                             procedure, the patient's blood pressure, pulse, and                            oxygen saturations were monitored continuously. The                            [id number]-HQ190L ([id number] Olympus colonoscope was                            introduced through the anus and advanced to the the                            cecum, identified by appendiceal orifice and                            ileocecal valve. The colonoscopy was extremely                            difficult due to inadequate bowel prep, a redundant                            colon, significant looping and a tortuous colon.                            Successful completion of the procedure was aided by                            changing the patient's position, using manual                            pressure, withdrawing and reinserting the scope,                            straightening and shortening the scope to obtain                            bowel loop reduction and using scope torsion. The  patient tolerated the procedure. The quality of the                            bowel preparation was inadequate. The ileocecal                            valve, appendiceal orifice, and rectum were                            photographed. Scope In: 9:35:42 AM Scope Out: 10:07:45 AM Scope Withdrawal Time: 0 hours 12 minutes 17 seconds  Total Procedure Duration: 0 hours 32 minutes 3 seconds  Findings:      The digital rectal exam findings include hemorrhoids. Pertinent       negatives include no palpable rectal lesions.      Copious quantities of semi-liquid stool was found in the entire colon,       making visualization difficult. Lavage of the area was performed using       copious amounts, resulting in incomplete clearance with fair       visualization.      The colon (entire examined portion) revealed grossly excessive looping.      A medium post mucosectomy scar was found in the  proximal descending       colon. The scar tissue was healthy in appearance. This is just proximal       to the previously placed tattoo. This is at the area of the plexiform       fibromyxoma. There is no longer an intraluminal component to this this       looks to be just subepithelial at this time. No additional endoscopic       resection is able to be performed.      A 3 mm polyp was found in the recto-sigmoid colon. The polyp was       sessile. The polyp was removed with a cold snare. Resection and       retrieval were complete.      Non-bleeding non-thrombosed external and internal hemorrhoids were found       during retroflexion, during perianal exam and during digital exam. The       hemorrhoids were Grade II (internal hemorrhoids that prolapse but reduce       spontaneously). Impression:               - Preparation of the colon was inadequate even                            after copious lavage.                           - Hemorrhoids found on digital rectal exam.                           - There was significant looping of the colon.                           - Post mucosectomy scar in the proximal descending  colon. This is adjacent to the previously placed                            tattoo. There is no longer any intraduodenal                            portion of the previous plexiform fibromyxoma, what                            we have left is subepithelial impression at that                            location.                           - One 3 mm polyp at the recto-sigmoid colon,                            removed with a cold snare. Resected and retrieved.                           - Non-bleeding non-thrombosed external and internal                            hemorrhoids. Moderate Sedation:      Not Applicable - Patient had care per Anesthesia. Recommendation:           - The patient will be observed post-procedure,                             until all discharge criteria are met.                           - Discharge patient to home.                           - Patient has a contact number available for                            emergencies. The signs and symptoms of potential                            delayed complications were discussed with the                            patient. Return to normal activities tomorrow.                            Written discharge instructions were provided to the                            patient.                           - High fiber diet.                           -  Use FiberCon 1-2 tablets PO daily.                           - Continue present medications.                           - Await pathology results.                           - Repeat colonoscopy for surveillance based on                            pathology results.                           - Consider repeat cross-sectional imaging to                            evaluate size, and if things are increasing, then                            certainly surgical management may be required, if                            things are stable then discussions about long-term                            monitoring/follow-up will need to be had.                           - The findings and recommendations were discussed                            with the patient.                           - The findings and recommendations were discussed                            with the patient's family. Procedure Code(s):        --- Professional ---                           [phone], Colonoscopy, flexible; with removal of                            tumor(s), polyp(s), or other lesion(s) by snare                            technique Diagnosis Code(s):        --- Professional ---                           Z86.010, Personal history of colonic polyps                           K64.1,  Second degree hemorrhoids                           D12.7, Benign neoplasm  of rectosigmoid junction                           Z98.890, Other specified postprocedural states CPT copyright 2022 American Medical Association. All rights reserved. The codes documented in this report are preliminary and upon coder review may  be revised to meet current compliance requirements. [name], MD 04/15/2023 10:33:18 AM Number of Addenda: 0

## 2023-04-15 NOTE — H&P (Signed)
GASTROENTEROLOGY PROCEDURE H&P NOTE   Primary Care Physician: Garlan Fillers, MD  HPI: Katherine Todd is a 57 y.o. female who presents for Colonoscopy for followup/surveillance of previous plexiform fibromyxoma and abnormal CT findings.  Past Medical History:  Diagnosis Date   Allergy    Anemia    Anxiety    Arthritis    Cataract    Depression    Fibrocystic breast    Fibromyalgia    11/26/16 'resolved'   GERD (gastroesophageal reflux disease)    Hyperlipidemia    Migraines    Obesity    Osteoporosis    Sleep apnea    not currently   Thyroid disease    Past Surgical History:  Procedure Laterality Date   COLONOSCOPY WITH PROPOFOL N/A 10/15/2022   Procedure: COLONOSCOPY WITH PROPOFOL;  Surgeon: Lemar Lofty., MD;  Location: [facility];  Service: Gastroenterology;  Laterality: N/A;   ENDOSCOPIC MUCOSAL RESECTION N/A 10/15/2022   Procedure: ENDOSCOPIC MUCOSAL RESECTION;  Surgeon: Meridee Score Netty Starring., MD;  Location: [facility];  Service: Gastroenterology;  Laterality: N/A;   EUS N/A 10/15/2022   Procedure: LOWER ENDOSCOPIC ULTRASOUND (EUS);  Surgeon: Lemar Lofty., MD;  Location: [facility];  Service: Gastroenterology;  Laterality: N/A;   GASTRIC BYPASS  04/2007, revision 2009 and 2011   "Mini" gastric bypass   HEMOSTASIS CLIP PLACEMENT  10/15/2022   Procedure: HEMOSTASIS CLIP PLACEMENT;  Surgeon: Lemar Lofty., MD;  Location: [facility];  Service: Gastroenterology;;   HEMOSTASIS CONTROL  10/15/2022   Procedure: HEMOSTASIS CONTROL;  Surgeon: Lemar Lofty., MD;  Location: [facility];  Service: Gastroenterology;;   HERNIA REPAIR  2009   incisional repair from gastsric bypass   LAPAROSCOPIC CHOLECYSTECTOMY  09/2002   Dr. Samuella Cota   POLYPECTOMY  10/15/2022   Procedure: POLYPECTOMY;  Surgeon: Lemar Lofty., MD;  Location: [facility];  Service: Gastroenterology;;   TONSILLECTOMY AND ADENOIDECTOMY  age  19   WISDOM TOOTH  EXTRACTION  age 71   No current facility-administered medications for this encounter.   No current facility-administered medications for this encounter. Allergies  Allergen Reactions   Albertsons Diphedryl [Diphenhydramine] Other (See Comments)    Heart palpitations  Heart palpitations    Atorvastatin Other (See Comments)    Lipitor= Muscle aches   Adhesive [Tape] Rash   Family History  Problem Relation Age of Onset   Hyperlipidemia Mother    Prostate cancer Father 57   Thyroid disease Father    Melanoma Brother 35       back of knee    Stroke Maternal Grandmother    Colon cancer Maternal Grandfather 87       colon caner   Pancreatic cancer Paternal Grandmother            Breast cancer Neg Hx    Esophageal cancer Neg Hx    Stomach cancer Neg Hx    Rectal cancer Neg Hx    Inflammatory bowel disease Neg Hx    Liver disease Neg Hx    Social History   Socioeconomic History   Marital status: Single    Spouse name: Not on file   Number of children: 0   Years of education: 20   Highest education level: Not on file  Occupational History    Comment: works for [employer] firm  Tobacco Use   Smoking status: Never   Smokeless tobacco: Never  Vaping Use   Vaping status: Never Used  Substance and Sexual Activity  Alcohol use: No   Drug use: No   Sexual activity: Not Currently    Birth control/protection: Post-menopausal  Other Topics Concern   Not on file  Social History Narrative   Lives alone   Caffeine- coffee, 2-3 cups daily, soda 1 daily   Social Determinants of Health   Financial Resource Strain: Not on file  Food Insecurity: Not on file  Transportation Needs: Not on file  Physical Activity: Not on file  Stress: Not on file  Social Connections: Not on file  Intimate Partner Violence: Not on file    Physical Exam: There were no vitals filed for this visit. There is no height or weight on file to calculate BMI. GEN: NAD EYE: Sclerae anicteric ENT: MMM CV:  Non-tachycardic GI: Soft, NT/ND NEURO:  Alert & Oriented x 3  Lab Results: No results for input(s): "WBC", "HGB", "HCT", "PLT" in the last 72 hours. BMET No results for input(s): "NA", "K", "CL", "CO2", "GLUCOSE", "BUN", "CREATININE", "CALCIUM" in the last 72 hours. LFT No results for input(s): "PROT", "ALBUMIN", "AST", "ALT", "ALKPHOS", "BILITOT", "BILIDIR", "IBILI" in the last 72 hours. PT/INR No results for input(s): "LABPROT", "INR" in the last 72 hours.   Impression / Plan: This is a 57 y.o.female who presents for Colonoscopy for followup/surveillance of previous plexiform fibromyxoma and abnormal CT findings.  The risks and benefits of endoscopic evaluation/treatment were discussed with the patient and/or family; these include but are not limited to the risk of perforation, infection, bleeding, missed lesions, lack of diagnosis, severe illness requiring hospitalization, as well as anesthesia and sedation related illnesses.  The patient's history has been reviewed, patient examined, no change in status, and deemed stable for procedure.  The patient and/or family is agreeable to proceed.    [name], MD [facility] Office # [phone]

## 2023-04-15 NOTE — Anesthesia Procedure Notes (Signed)
Procedure Name: MAC Date/Time: 04/15/2023 9:20 AM  Performed by: Maurene Capes, CRNAPre-anesthesia Checklist: Patient identified, Emergency Drugs available, Suction available and Patient being monitored Patient Re-evaluated:Patient Re-evaluated prior to induction Oxygen Delivery Method: Simple face mask Induction Type: IV induction Placement Confirmation: positive ETCO2 Dental Injury: Teeth and Oropharynx as per pre-operative assessment

## 2023-04-15 NOTE — Transfer of Care (Signed)
Immediate Anesthesia Transfer of Care Note  Patient: Katherine Todd  Procedure(s) Performed: COLONOSCOPY WITH PROPOFOL ESOPHAGOGASTRODUODENOSCOPY (EGD) WITH PROPOFOL BIOPSY POLYPECTOMY  Patient Location: PACU and Endoscopy Unit  Anesthesia Type:MAC  Level of Consciousness: awake, alert , and oriented  Airway & Oxygen Therapy: Patient Spontanous Breathing and Patient connected to face mask oxygen  Post-op Assessment: Report given to RN and Post -op Vital signs reviewed and stable  Post vital signs: Reviewed and stable  Last Vitals:  Vitals Value Taken Time  BP 117/78 04/15/23 1030  Temp 36.2 C 04/15/23 1015  Pulse 74 04/15/23 1031  Resp 16 04/15/23 1031  SpO2 100 % 04/15/23 1031  Vitals shown include unfiled device data.  Last Pain:  Vitals:   04/15/23 1020  TempSrc:   PainSc: 0-No pain         Complications: No notable events documented.

## 2023-04-17 ENCOUNTER — Encounter (HOSPITAL_COMMUNITY): Payer: Self-pay | Admitting: Gastroenterology

## 2023-04-18 NOTE — Anesthesia Postprocedure Evaluation (Signed)
Anesthesia Post Note  Patient: Katherine Todd  Procedure(s) Performed: COLONOSCOPY WITH PROPOFOL ESOPHAGOGASTRODUODENOSCOPY (EGD) WITH PROPOFOL BIOPSY POLYPECTOMY     Patient location during evaluation: Endoscopy Anesthesia Type: MAC Level of consciousness: oriented, awake and alert and awake Pain management: pain level controlled Vital Signs Assessment: post-procedure vital signs reviewed and stable Respiratory status: spontaneous breathing, nonlabored ventilation, respiratory function stable and patient connected to nasal cannula oxygen Cardiovascular status: blood pressure returned to baseline and stable Postop Assessment: no headache, no backache and no apparent nausea or vomiting Anesthetic complications: no   No notable events documented.  Last Vitals:  Vitals:   04/15/23 1030 04/15/23 1040  BP: 117/78 106/73  Pulse: 75 67  Resp: 14 15  Temp:    SpO2: 100% 98%    Last Pain:  Vitals:   04/15/23 1040  TempSrc:   PainSc: 0-No pain                 Collene Schlichter

## 2023-04-19 ENCOUNTER — Other Ambulatory Visit: Payer: Self-pay | Admitting: Obstetrics & Gynecology

## 2023-04-19 ENCOUNTER — Other Ambulatory Visit: Payer: Self-pay

## 2023-04-19 ENCOUNTER — Encounter: Payer: Self-pay | Admitting: Gastroenterology

## 2023-04-19 DIAGNOSIS — Z1231 Encounter for screening mammogram for malignant neoplasm of breast: Secondary | ICD-10-CM

## 2023-04-19 MED ORDER — FLUCONAZOLE 100 MG PO TABS: ORAL_TABLET | ORAL | 0 refills | Status: AC

## 2023-04-23 ENCOUNTER — Ambulatory Visit (HOSPITAL_BASED_OUTPATIENT_CLINIC_OR_DEPARTMENT_OTHER): Payer: BC Managed Care – PPO | Admitting: Obstetrics & Gynecology

## 2023-04-23 ENCOUNTER — Telehealth: Payer: Self-pay | Admitting: *Deleted

## 2023-04-23 MED ORDER — ONDANSETRON 4 MG PO TBDP: 4.0000 mg | ORAL_TABLET | Freq: Four times a day (QID) | ORAL | 1 refills | Status: AC | PRN

## 2023-04-23 NOTE — Telephone Encounter (Signed)
I refilled patients Zofran , pharmacy fax request. It was originally prescribed from Dr Adela Lank for Nausea and Vomiting. Patients last appointment with Dr Adela Lank was 12/2022

## 2023-04-27 ENCOUNTER — Encounter (HOSPITAL_BASED_OUTPATIENT_CLINIC_OR_DEPARTMENT_OTHER): Payer: Self-pay | Admitting: Obstetrics & Gynecology

## 2023-04-27 ENCOUNTER — Ambulatory Visit (INDEPENDENT_AMBULATORY_CARE_PROVIDER_SITE_OTHER): Payer: BC Managed Care – PPO | Admitting: Obstetrics & Gynecology

## 2023-04-27 VITALS — BP 121/78 | HR 70 | Ht 64.0 in | Wt 144.8 lb

## 2023-04-27 DIAGNOSIS — Z01419 Encounter for gynecological examination (general) (routine) without abnormal findings: Secondary | ICD-10-CM | POA: Diagnosis not present

## 2023-04-27 DIAGNOSIS — Z9884 Bariatric surgery status: Secondary | ICD-10-CM

## 2023-04-27 DIAGNOSIS — Z780 Asymptomatic menopausal state: Secondary | ICD-10-CM

## 2023-04-27 NOTE — Progress Notes (Signed)
57 y.o. G0P0000 Single White or Caucasian female here for annual exam.  Had colonoscopy with lesion showing spindle cells.  Has had follow colonoscopy 8/1 with negative biopsies but still considering partial colectomy.  Has CT scheduled tomorrow.  Denies vaginal bleeding.  Patient's last menstrual period was 09/14/2010.          Sexually active: No.  The current method of family planning is post menopausal status.    Exercising: pilates 3 x's weekly with stretching Smoker:  no  Health Maintenance: Pap:  03/12/2021 Negative History of abnormal Pap:  no MMG:  06/02/2022 Negative Colonoscopy:  04/15/2023 BMD:   04/30/2022.  On Prolia. Screening Labs: Done with Dr. Jarold Motto   reports that she has never smoked. She has never used smokeless tobacco. She reports that she does not drink alcohol and does not use drugs.  Past Medical History:  Diagnosis Date   Allergy    Anemia    Anxiety    Arthritis    Cataract    Depression    Fibrocystic breast    Fibromyalgia    11/26/16 'resolved'   GERD (gastroesophageal reflux disease)    Hyperlipidemia    Migraines    Obesity    Osteoporosis    Sleep apnea    not currently   Thyroid disease     Past Surgical History:  Procedure Laterality Date   BIOPSY  04/15/2023   Procedure: BIOPSY;  Surgeon: Lemar Lofty., MD;  Location: [facility];  Service: Gastroenterology;;   COLONOSCOPY WITH PROPOFOL N/A 10/15/2022   Procedure: COLONOSCOPY WITH PROPOFOL;  Surgeon: Lemar Lofty., MD;  Location: [facility];  Service: Gastroenterology;  Laterality: N/A;   COLONOSCOPY WITH PROPOFOL N/A 04/15/2023   Procedure: COLONOSCOPY WITH PROPOFOL;  Surgeon: Meridee Score Netty Starring., MD;  Location: [facility];  Service: Gastroenterology;  Laterality: N/A;   ENDOSCOPIC MUCOSAL RESECTION N/A 10/15/2022   Procedure: ENDOSCOPIC MUCOSAL RESECTION;  Surgeon: Meridee Score Netty Starring., MD;  Location: [facility];  Service: Gastroenterology;   Laterality: N/A;   ESOPHAGOGASTRODUODENOSCOPY (EGD) WITH PROPOFOL N/A 04/15/2023   Procedure: ESOPHAGOGASTRODUODENOSCOPY (EGD) WITH PROPOFOL;  Surgeon: Meridee Score Netty Starring., MD;  Location: [facility];  Service: Gastroenterology;  Laterality: N/A;   EUS N/A 10/15/2022   Procedure: LOWER ENDOSCOPIC ULTRASOUND (EUS);  Surgeon: Lemar Lofty., MD;  Location: [facility];  Service: Gastroenterology;  Laterality: N/A;   GASTRIC BYPASS  04/2007, revision 2009 and 2011   "Mini" gastric bypass   HEMOSTASIS CLIP PLACEMENT  10/15/2022   Procedure: HEMOSTASIS CLIP PLACEMENT;  Surgeon: Lemar Lofty., MD;  Location: [facility];  Service: Gastroenterology;;   HEMOSTASIS CONTROL  10/15/2022   Procedure: HEMOSTASIS CONTROL;  Surgeon: Lemar Lofty., MD;  Location: [facility];  Service: Gastroenterology;;   HERNIA REPAIR  2009   incisional repair from gastsric bypass   LAPAROSCOPIC CHOLECYSTECTOMY  09/2002   Dr. Samuella Cota   POLYPECTOMY  10/15/2022   Procedure: POLYPECTOMY;  Surgeon: Lemar Lofty., MD;  Location: [facility];  Service: Gastroenterology;;   POLYPECTOMY  04/15/2023   Procedure: POLYPECTOMY;  Surgeon: Lemar Lofty., MD;  Location: [facility];  Service: Gastroenterology;;   TONSILLECTOMY AND ADENOIDECTOMY  age  49   WISDOM TOOTH EXTRACTION  age 60    Current Outpatient Medications  Medication Sig Dispense Refill   ARIPiprazole (ABILIFY) 5 MG tablet Take 2.5 mg by mouth in the morning.     buPROPion (WELLBUTRIN XL) 300 MG 24 hr tablet Take 300 mg by mouth in the  morning.     calcium carbonate (TUMS - DOSED IN MG ELEMENTAL CALCIUM) 500 MG chewable tablet Chew 1 tablet by mouth in the morning.     cetirizine (ZYRTEC) 10 MG tablet Take 10 mg by mouth at bedtime.     Cholecalciferol (VITAMIN D3 PO) Place 1 drop into both eyes in the morning.     clonazePAM (KLONOPIN) 0.5 MG tablet Take 0.5 mg by mouth 2 (two) times daily as needed for anxiety.      CYANOCOBALAMIN PO Take 250 mcg by mouth every evening.     denosumab (PROLIA) 60 MG/ML SOSY injection Inject 60 mg into the skin every 6 (six) months.     famotidine (PEPCID) 20 MG tablet Take 20 mg by mouth 2 (two) times daily.     fluconazole (DIFLUCAN) 100 MG tablet Take 2 tablets (200 mg total) by mouth daily for 1 day, THEN 1 tablet (100 mg total) daily for 13 days. 15 tablet 0   ipratropium (ATROVENT) 0.03 % nasal spray Place 2 sprays into both nostrils in the morning.  2   levothyroxine (SYNTHROID) 50 MCG tablet Take 50 mcg by mouth daily before breakfast.     loperamide (IMODIUM A-D) 2 MG tablet Take 2 tablets daily at bedtime. Take 1 tablet daily in the morning 30 tablet 0   lovastatin (MEVACOR) 20 MG tablet Take 20 mg by mouth every Monday at 6 PM.     Multiple Vitamin (MULTIVITAMIN WITH MINERALS) TABS tablet Take 1 tablet by mouth in the morning and at bedtime.     Multiple Vitamins-Minerals (OCUVITE EXTRA PO) Take 1 tablet by mouth at bedtime.     omeprazole (PRILOSEC) 20 MG capsule Take 20 mg by mouth every morning.     ondansetron (ZOFRAN-ODT) 4 MG disintegrating tablet Take 1 tablet (4 mg total) by mouth every 6 (six) hours as needed for nausea or vomiting. 30 tablet 1   propranolol (INDERAL) 10 MG tablet Take 1 tablet by mouth daily as needed (difficulty driving over bridges).  12   sucralfate (CARAFATE) 1 g tablet Take 1 tablet (1 g total) by mouth every 6 (six) hours. You can dissolve 1 tablet in a TBSP of warm water and drink as a slurry 120 tablet 0   SUMAtriptan (IMITREX) 100 MG tablet TAKE 1 TABLET BY MOUTH ONCE AS NEEDED FOR MIGRAINE(MAY REPEAT IN 2 HORUS. MAX OF 2 TABLETS PER 24 HOURS)     tirzepatide (MOUNJARO) 2.5 MG/0.5ML Pen Inject 2.5 mg into the skin once a week. 2 mL 5   tirzepatide (MOUNJARO) 5 MG/0.5ML Pen Inject 5 mg into the skin once a week. (Patient taking differently: Inject 5 mg into the skin every Saturday.) 2 mL 11   tirzepatide (MOUNJARO) 7.5 MG/0.5ML Pen  Inject 7.5 mg into the skin once a week. 2 mL 3   triamterene-hydrochlorothiazide (MAXZIDE-25) 37.5-25 MG tablet Take 1 tablet by mouth every morning.     Vitamin D, Ergocalciferol, (DRISDOL) 50000 UNITS CAPS Take 50,000 Units by mouth See admin instructions. Take 1 capsule twice daily on Mondays through Fridays.     vitamin E 180 MG (400 UNITS) capsule Take 400 Units by mouth at bedtime.     vortioxetine HBr (TRINTELLIX) 20 MG TABS tablet Take 20 mg by mouth in the morning.     No current facility-administered medications for this visit.    Family History  Problem Relation Age of Onset   Hyperlipidemia Mother    Prostate cancer Father 80  Thyroid disease Father    Melanoma Brother 35       back of knee    Stroke Maternal Grandmother    Colon cancer Maternal Grandfather 99       colon caner   Pancreatic cancer Paternal Grandmother            Breast cancer Neg Hx    Esophageal cancer Neg Hx    Stomach cancer Neg Hx    Rectal cancer Neg Hx    Inflammatory bowel disease Neg Hx    Liver disease Neg Hx     ROS: Constitutional: negative Genitourinary:negative  Exam:   BP 121/78 (BP Location: Right Arm, Patient Position: Sitting, Cuff Size: Normal)   Pulse 70   Ht 5\' 4"  (1.626 m) Comment: Reported  Wt 144 lb 12.8 oz (65.7 kg)   LMP 09/14/2010   BMI 24.85 kg/m   Height: 5\' 4"  (162.6 cm) (Reported)  General appearance: alert, cooperative and appears stated age Head: Normocephalic, without obvious abnormality, atraumatic Neck: no adenopathy, supple, symmetrical, trachea midline and thyroid normal to inspection and palpation Lungs: clear to auscultation bilaterally Breasts: normal appearance, no masses or tenderness Heart: regular rate and rhythm Abdomen: soft, non-tender; bowel sounds normal; no masses,  no organomegaly Extremities: extremities normal, atraumatic, no cyanosis or edema Skin: Skin color, texture, turgor normal. No rashes or lesions Lymph nodes: Cervical,  supraclavicular, and axillary nodes normal. No abnormal inguinal nodes palpated Neurologic: Grossly normal   Pelvic: External genitalia:  no lesions              Urethra:  normal appearing urethra with no masses, tenderness or lesions              Bartholins and Skenes: normal                 Vagina: normal appearing vagina with normal color and no discharge, no lesions              Cervix: no lesions              Pap taken: No. Bimanual Exam:  Uterus:  normal size, contour, position, consistency, mobility, non-tender              Adnexa: no mass, fullness, tenderness              Anus: no lesion  Chaperone, Ina Homes, CMA, was present for exam.  Assessment/Plan: 1. Well woman exam with routine gynecological exam - Pap smear  with neg HR HPV 2022 - Mammogram 05/2022 - Colonoscopy 04/14/2022 - Bone mineral density is done with PCP - lab work done with PCP, Dr. Jarold Motto - vaccines reviewed/updated  2. History of gastric bypass  3. Postmenopausal - not on HRT

## 2023-04-28 ENCOUNTER — Ambulatory Visit (HOSPITAL_COMMUNITY)
Admission: RE | Admit: 2023-04-28 | Discharge: 2023-04-28 | Disposition: A | Discharge status: Home / Self Care | Payer: BC Managed Care – PPO | Source: Ambulatory Visit | Attending: Gastroenterology | Admitting: Gastroenterology

## 2023-04-28 DIAGNOSIS — K639 Disease of intestine, unspecified: Secondary | ICD-10-CM | POA: Insufficient documentation

## 2023-04-28 MED ORDER — IOHEXOL 300 MG/ML  SOLN
100.0000 mL | Freq: Once | INTRAMUSCULAR | Status: AC | PRN
  Administered 2023-04-28: 100 mL via INTRAVENOUS

## 2023-04-28 MED ORDER — IOHEXOL 9 MG/ML PO SOLN
ORAL | Status: AC
  Filled 2023-04-28: qty 1000

## 2023-04-28 MED ORDER — IOHEXOL 9 MG/ML PO SOLN
500.0000 mL | ORAL | Status: AC
  Administered 2023-04-28: 1000 mL via ORAL

## 2023-05-01 ENCOUNTER — Other Ambulatory Visit (HOSPITAL_COMMUNITY): Payer: Self-pay

## 2023-05-02 ENCOUNTER — Encounter: Payer: Self-pay | Admitting: Gastroenterology

## 2023-05-05 ENCOUNTER — Encounter: Payer: Self-pay | Admitting: Gastroenterology

## 2023-05-05 MED ORDER — FLUCONAZOLE 200 MG PO TABS: 200.0000 mg | ORAL_TABLET | Freq: Every day | ORAL | 0 refills | Status: AC

## 2023-05-05 NOTE — Telephone Encounter (Signed)
Diflucan prescription sent to pharmacy on file.

## 2023-05-05 NOTE — Telephone Encounter (Signed)
Called imaging support and spoke with [place]. She will contact radiologist to see if 8/14 CT report can be read.

## 2023-05-05 NOTE — Telephone Encounter (Signed)
  Katherine Todd can you please order this patient fluconazole 200 mg/day for 28 days.  I will let her know, I will see if this will help.  Thanks

## 2023-05-11 ENCOUNTER — Other Ambulatory Visit: Payer: Self-pay

## 2023-05-11 MED ORDER — SUCRALFATE 1 G PO TABS: 1.0000 g | ORAL_TABLET | Freq: Four times a day (QID) | ORAL | 1 refills | Status: DC

## 2023-05-11 NOTE — Progress Notes (Signed)
Refill sent for Carafate

## 2023-05-13 ENCOUNTER — Encounter: Payer: Self-pay | Admitting: Gastroenterology

## 2023-05-19 ENCOUNTER — Other Ambulatory Visit: Payer: Self-pay

## 2023-05-19 NOTE — Progress Notes (Signed)
The proposed treatment discussed in conference is for discussion purpose only and is not a binding recommendation.  The patients have not been physically examined, or presented with their treatment options.  Therefore, final treatment plans cannot be decided.  

## 2023-05-21 ENCOUNTER — Encounter: Payer: Self-pay | Admitting: Gastroenterology

## 2023-05-22 NOTE — Progress Notes (Signed)
I called and discussed with Dr. Orinda Kenner her case was discussed at multidisciplinary conference this week. We reviewed her pathology and her radiology imaging and her endoscopic imaging with our pathologist and surgeons. Her case is quite rare based on the final pathology. Overall stability has been noted on imaging. After discussion with the entire team, consensus was that it would be reasonable for the patient to meet a surgeon and discuss if she wanted to have partial colectomy versus continued monitoring with imaging and colonoscopy. I called and spoke with the patient on 9/6 and discussed these consensus recommendations. She is in agreement with moving forward with surgical consultation. We will place this referral to our colorectal surgery colleagues at [facility]. I will update the patient's primary gastroenterologist. For now her next full colonoscopy would not be recommended for at least 3 years, if we were just looking at her adenoma status, but with this plexiform myofibroma, indication may be required for repeat evaluation pending what her decision is with surgery.   [name], MD [facility] Office # [phone]

## 2023-05-22 NOTE — Progress Notes (Signed)
Great, thanks Baker, appreciate you calling her.

## 2023-05-23 ENCOUNTER — Encounter: Payer: Self-pay | Admitting: Gastroenterology

## 2023-05-23 DIAGNOSIS — R1013 Epigastric pain: Secondary | ICD-10-CM

## 2023-05-23 DIAGNOSIS — R12 Heartburn: Secondary | ICD-10-CM

## 2023-05-23 DIAGNOSIS — K219 Gastro-esophageal reflux disease without esophagitis: Secondary | ICD-10-CM

## 2023-05-24 NOTE — Telephone Encounter (Signed)
Patient scheduled for EGD with provider for 9/26 at 4:00 pm. Advised patient she will received instructions in MyChart. Patient advised understanding.

## 2023-05-24 NOTE — Progress Notes (Signed)
The referral to [facility] has been made and records faxed.

## 2023-05-24 NOTE — Telephone Encounter (Signed)
Called and spoke with patient to schedule EGD. Pt needs to check with her job and give [place] a call back.

## 2023-05-24 NOTE — Telephone Encounter (Signed)
  Katherine Todd can you book her for next EGD opening - I think there are a few spots last week of Sept that are open? Thank you!

## 2023-05-24 NOTE — Telephone Encounter (Signed)
Ambulatory referral to GI in epic.  EGD instructions sent to patient via MyChart.

## 2023-05-25 ENCOUNTER — Encounter (HOSPITAL_BASED_OUTPATIENT_CLINIC_OR_DEPARTMENT_OTHER): Payer: Self-pay | Admitting: Obstetrics & Gynecology

## 2023-05-28 ENCOUNTER — Other Ambulatory Visit: Payer: Self-pay

## 2023-06-04 ENCOUNTER — Ambulatory Visit
Admission: RE | Admit: 2023-06-04 | Discharge: 2023-06-04 | Disposition: A | Discharge status: Home / Self Care | Payer: BC Managed Care – PPO | Source: Ambulatory Visit | Attending: Obstetrics & Gynecology | Admitting: Obstetrics & Gynecology

## 2023-06-04 DIAGNOSIS — Z1231 Encounter for screening mammogram for malignant neoplasm of breast: Secondary | ICD-10-CM

## 2023-06-07 ENCOUNTER — Other Ambulatory Visit (HOSPITAL_COMMUNITY): Payer: Self-pay | Admitting: *Deleted

## 2023-06-08 ENCOUNTER — Encounter (HOSPITAL_COMMUNITY)
Admission: RE | Admit: 2023-06-08 | Discharge: 2023-06-08 | Disposition: A | Discharge status: Home / Self Care | Payer: BC Managed Care – PPO | Source: Ambulatory Visit | Attending: Internal Medicine | Admitting: Internal Medicine

## 2023-06-08 DIAGNOSIS — M810 Age-related osteoporosis without current pathological fracture: Secondary | ICD-10-CM | POA: Insufficient documentation

## 2023-06-08 MED ORDER — DENOSUMAB 60 MG/ML ~~LOC~~ SOSY
60.0000 mg | PREFILLED_SYRINGE | Freq: Once | SUBCUTANEOUS | Status: AC
  Administered 2023-06-08: 60 mg via SUBCUTANEOUS

## 2023-06-08 MED ORDER — DENOSUMAB 60 MG/ML ~~LOC~~ SOSY
PREFILLED_SYRINGE | SUBCUTANEOUS | Status: AC
  Filled 2023-06-08: qty 1

## 2023-06-10 ENCOUNTER — Ambulatory Visit: Payer: BC Managed Care – PPO | Admitting: Gastroenterology

## 2023-06-10 ENCOUNTER — Encounter: Payer: Self-pay | Admitting: Gastroenterology

## 2023-06-10 VITALS — BP 106/66 | HR 85 | Temp 96.2°F | Resp 19 | Ht 64.0 in | Wt 146.6 lb

## 2023-06-10 DIAGNOSIS — K209 Esophagitis, unspecified without bleeding: Secondary | ICD-10-CM | POA: Diagnosis not present

## 2023-06-10 DIAGNOSIS — R12 Heartburn: Secondary | ICD-10-CM

## 2023-06-10 DIAGNOSIS — Z8619 Personal history of other infectious and parasitic diseases: Secondary | ICD-10-CM

## 2023-06-10 DIAGNOSIS — D130 Benign neoplasm of esophagus: Secondary | ICD-10-CM | POA: Diagnosis not present

## 2023-06-10 MED ORDER — OMEPRAZOLE 40 MG PO CPDR: 40.0000 mg | DELAYED_RELEASE_CAPSULE | Freq: Two times a day (BID) | ORAL | 3 refills | Status: DC

## 2023-06-10 MED ORDER — SODIUM CHLORIDE 0.9 % IV SOLN: 500.0000 mL | Freq: Once | INTRAVENOUS | Status: DC

## 2023-06-10 NOTE — Op Note (Signed)
[facility] Patient Name: Katherine Todd Procedure Date: 06/10/2023 2:50 PM MRN: [id number] Endoscopist: Viviann Spare P. Adela Lank , MD, [id number] Age: 57 Referring MD:  Date of Birth: 1965-11-16 Gender: Female Account #: [id number] Procedure:                Upper GI endoscopy Indications:              Heartburn / pyrosis - persistent symptoms despite                            protonix twice daily. Carafate / peptobismol helps.                            History of esophageal candidiasis on the last EGD                            in August - two treatment courses of fluconazole                            have not provided relief Medicines:                Monitored Anesthesia Care Procedure:                Pre-Anesthesia Assessment:                           - Prior to the procedure, a History and Physical                            was performed, and patient medications and                            allergies were reviewed. The patient's tolerance of                            previous anesthesia was also reviewed. The risks                            and benefits of the procedure and the sedation                            options and risks were discussed with the patient.                            All questions were answered, and informed consent                            was obtained. Prior Anticoagulants: The patient has                            taken no anticoagulant or antiplatelet agents. ASA                            Grade Assessment: II - A patient with mild systemic  disease. After reviewing the risks and benefits,                            the patient was deemed in satisfactory condition to                            undergo the procedure.                           After obtaining informed consent, the endoscope was                            passed under direct vision. Throughout the                            procedure, the  patient's blood pressure, pulse, and                            oxygen saturations were monitored continuously. The                            GIF [id number] #[id number] was introduced through the                            mouth, and advanced to the jejunum. The upper GI                            endoscopy was accomplished without difficulty. The                            patient tolerated the procedure well. Scope In: Scope Out: Findings:                 The Z-line was regular and was found 33 cm from the                            incisors.                           A widely patent Schatzki ring was found at the                            gastroesophageal junction.                           A single 3 mm polyp was found 16 cm from the                            incisors. Appears to be a benign squamous                            papilloma. The polyp was removed with a cold biopsy                            forceps.  Resection and retrieval were complete.                           The exam of the esophagus was otherwise normal. NO                            erosive changes. NO overt evidence of candidiasis.                           Biopsies were obtained from the proximal and distal                            esophagus with cold forceps for histology.                           Evidence of a gastric bypass was found. A gastric                            pouch was found, roughly 10cm in length. Mildly                            erythematous. Previously biopsies ruled out H                            pylori, no additional biopsies taken today. .                           The exam of the gastric pouch was otherwise normal.                            Given narrowed lumen retroflexed views of the                            cardia not obtained.                           The examined small bowel limb was normal. Complications:            No immediate complications. Estimated blood loss:                             Minimal. Estimated Blood Loss:     Estimated blood loss was minimal. Impression:               - Z-line regular, 33 cm from the incisors.                           - Widely patent Schatzki ring.                           - Esophageal polyp was found as above. Resected and                            retrieved.                           -  Normal esophagus otherwise - random biopsies taken                           - Gastric bypass.                           - Mildly erythematous gastric pouch.                           - Normal examined small bowel limb.                           No evidence of persistent / recurrent candidiasis.                            She does have benefit with PRN antacids, suggesting                            these symptoms are reflux mediated. Would consider                            transition from protonix to omeprazole capsules                            40mg  twice daily - crack capsule open prior to                            ingestion to help with absorption given gastric                            bypass anatomy. Continue Carafate as needed. Recommendation:           - Patient has a contact number available for                            emergencies. The signs and symptoms of potential                            delayed complications were discussed with the                            patient. Return to normal activities tomorrow.                            Written discharge instructions were provided to the                            patient.                           - Resume previous diet.                           - Continue present medications.                           -  Would try switching protonix to omeprazole and                            crack capsules open prior to ingestion, 40mg  twice                            daily                           - Await pathology results and course on different                             regimen. Viviann Spare P. Darolyn Double, MD 06/10/2023 3:42:10 PM This report has been signed electronically.

## 2023-06-10 NOTE — Progress Notes (Signed)
Sedate, gd SR, tolerated procedure well, VSS, report to RN 

## 2023-06-10 NOTE — Progress Notes (Signed)
[facility] History and Physical   Primary Care Physician:  Garlan Fillers, MD   Reason for Procedure:   Burning sensation in the chest - history of esophageal candidiasis - treated with fluconazole x 2, and PPI / carafate with persistent symptoms.   Plan:    EGD     HPI: Katherine Todd is a 57 y.o. female  here for EGD to re-evaluate her esophagus. She has persistent burning sensation in her chest, last EGD showed esophageal candidiasis. Treated with fluconazole x 2 with persistent symptoms. On PPI and carafate, persistent symptoms. EGD to re-evaluate and see if symptoms are related to persistent candidiasis. History of gastric bypass.  Otherwise feels well without any cardiopulmonary symptoms.   I have discussed risks / benefits of anesthesia and endoscopic procedure with Leonard Downing and they wish to proceed with the exams as outlined today.    Past Medical History:  Diagnosis Date   Allergy    Anemia    Anxiety    Arthritis    Cataract    Depression    Fibrocystic breast    Fibromyalgia    11/26/16 'resolved'   GERD (gastroesophageal reflux disease)    Hyperlipidemia    Migraines    Obesity    Osteoporosis    Sleep apnea    not currently   Thyroid disease     Past Surgical History:  Procedure Laterality Date   BIOPSY  04/15/2023   Procedure: BIOPSY;  Surgeon: Lemar Lofty., MD;  Location: [facility];  Service: Gastroenterology;;   COLONOSCOPY WITH PROPOFOL N/A 10/15/2022   Procedure: COLONOSCOPY WITH PROPOFOL;  Surgeon: Lemar Lofty., MD;  Location: [facility];  Service: Gastroenterology;  Laterality: N/A;   COLONOSCOPY WITH PROPOFOL N/A 04/15/2023   Procedure: COLONOSCOPY WITH PROPOFOL;  Surgeon: Meridee Score Netty Starring., MD;  Location: [facility];  Service: Gastroenterology;  Laterality: N/A;   ENDOSCOPIC MUCOSAL RESECTION N/A 10/15/2022   Procedure: ENDOSCOPIC MUCOSAL RESECTION;  Surgeon: Meridee Score Netty Starring., MD;   Location: [facility];  Service: Gastroenterology;  Laterality: N/A;   ESOPHAGOGASTRODUODENOSCOPY (EGD) WITH PROPOFOL N/A 04/15/2023   Procedure: ESOPHAGOGASTRODUODENOSCOPY (EGD) WITH PROPOFOL;  Surgeon: Meridee Score Netty Starring., MD;  Location: [facility];  Service: Gastroenterology;  Laterality: N/A;   EUS N/A 10/15/2022   Procedure: LOWER ENDOSCOPIC ULTRASOUND (EUS);  Surgeon: Lemar Lofty., MD;  Location: [facility];  Service: Gastroenterology;  Laterality: N/A;   GASTRIC BYPASS  04/2007, revision 2009 and 2011   "Mini" gastric bypass   HEMOSTASIS CLIP PLACEMENT  10/15/2022   Procedure: HEMOSTASIS CLIP PLACEMENT;  Surgeon: Lemar Lofty., MD;  Location: [facility];  Service: Gastroenterology;;   HEMOSTASIS CONTROL  10/15/2022   Procedure: HEMOSTASIS CONTROL;  Surgeon: Lemar Lofty., MD;  Location: [facility];  Service: Gastroenterology;;   HERNIA REPAIR  2009   incisional repair from gastsric bypass   LAPAROSCOPIC CHOLECYSTECTOMY  09/2002   Dr. Samuella Cota   POLYPECTOMY  10/15/2022   Procedure: POLYPECTOMY;  Surgeon: Lemar Lofty., MD;  Location: [facility];  Service: Gastroenterology;;   POLYPECTOMY  04/15/2023   Procedure: POLYPECTOMY;  Surgeon: Lemar Lofty., MD;  Location: [facility];  Service: Gastroenterology;;   TONSILLECTOMY AND ADENOIDECTOMY  age  73   WISDOM TOOTH EXTRACTION  age 62    Prior to Admission medications   Medication Sig Start Date End Date Taking? Authorizing Provider  Na Sulfate-K Sulfate-Mg Sulf 17.5-3.13-1.6 GM/177ML SOLN See admin instructions. 01/15/23  Yes [provider]  Norethindrone-Ethinyl  Estradiol-Fe Biphas (LO LOESTRIN FE) 1 MG-10 MCG / 10 MCG tablet Take 1 tablet by mouth daily. 02/10/16  Yes [provider]  ondansetron (ZOFRAN) 4 MG tablet Take 4 mg by mouth daily as needed. 06/08/23  Yes [provider]  pantoprazole (PROTONIX) 40 MG tablet Take 40 mg by mouth every morning. 05/18/23   Yes [provider]  ARIPiprazole (ABILIFY) 5 MG tablet Take 2.5 mg by mouth in the morning.    [provider]  buPROPion (WELLBUTRIN XL) 300 MG 24 hr tablet Take 300 mg by mouth in the morning. 03/18/14   [provider]  calcium carbonate (TUMS - DOSED IN MG ELEMENTAL CALCIUM) 500 MG chewable tablet Chew 1 tablet by mouth in the morning.    [provider]  cetirizine (ZYRTEC) 10 MG tablet Take 10 mg by mouth at bedtime.    [provider]  Cholecalciferol (VITAMIN D3 PO) Place 1 drop into both eyes in the morning.    [provider]  clonazePAM (KLONOPIN) 0.5 MG tablet Take 0.5 mg by mouth 2 (two) times daily as needed for anxiety.    [provider]  CYANOCOBALAMIN PO Take 250 mcg by mouth every evening.    [provider]  denosumab (PROLIA) 60 MG/ML SOSY injection Inject 60 mg into the skin every 6 (six) months.    [provider]  famotidine (PEPCID) 20 MG tablet Take 20 mg by mouth 2 (two) times daily. 09/25/19   [provider]  levothyroxine (SYNTHROID) 50 MCG tablet Take 50 mcg by mouth daily before breakfast. 02/19/22   [provider]  lovastatin (MEVACOR) 20 MG tablet Take 20 mg by mouth every Monday at 6 PM.    [provider]  Multiple Vitamin (MULTIVITAMIN WITH MINERALS) TABS tablet Take 1 tablet by mouth in the morning and at bedtime.    [provider]  Multiple Vitamins-Minerals (OCUVITE EXTRA PO) Take 1 tablet by mouth at bedtime.    [provider]  ondansetron (ZOFRAN-ODT) 4 MG disintegrating tablet Take 1 tablet (4 mg total) by mouth every 6 (six) hours as needed for nausea or vomiting. 04/23/23   Skilynn Durney, Willaim Rayas, MD  propranolol (INDERAL) 10 MG tablet Take 1 tablet by mouth daily as needed (difficulty driving over bridges). 04/17/18   [provider]  sucralfate (CARAFATE) 1 g tablet Take 1 tablet (1 g total) by mouth every 6 (six) hours. You can  dissolve 1 tablet in a TBSP of distilled water and drink as a slurry 05/11/23   Carlyn Mullenbach, Willaim Rayas, MD  SUMAtriptan (IMITREX) 100 MG tablet TAKE 1 TABLET BY MOUTH ONCE AS NEEDED FOR MIGRAINE(MAY REPEAT IN 2 HORUS. MAX OF 2 TABLETS PER 24 HOURS) 01/07/15   [provider]  tirzepatide [facility]) 2.5 MG/0.5ML Pen Inject 2.5 mg into the skin once a week. 06/01/22     tirzepatide (MOUNJARO) 5 MG/0.5ML Pen Inject 5 mg into the skin once a week. Patient taking differently: Inject 5 mg into the skin every Saturday. 08/17/22     tirzepatide (MOUNJARO) 7.5 MG/0.5ML Pen Inject 7.5 mg into the skin once a week. 02/25/23     triamterene-hydrochlorothiazide (MAXZIDE-25) 37.5-25 MG tablet Take 1 tablet by mouth every morning. 03/09/22   [provider]  Vitamin D, Ergocalciferol, (DRISDOL) 50000 UNITS CAPS Take 50,000 Units by mouth See admin instructions. Take 1 capsule twice daily on Mondays through Fridays.    [provider]  vitamin E 180 MG (400  UNITS) capsule Take 400 Units by mouth at bedtime.    [provider]  vortioxetine HBr (TRINTELLIX) 20 MG TABS tablet Take 20 mg by mouth in the morning.    [provider]    Current Outpatient Medications  Medication Sig Dispense Refill   ARIPiprazole (ABILIFY) 5 MG tablet Take 2.5 mg by mouth in the morning.     buPROPion (WELLBUTRIN XL) 300 MG 24 hr tablet Take 300 mg by mouth in the morning.     calcium carbonate (TUMS - DOSED IN MG ELEMENTAL CALCIUM) 500 MG chewable tablet Chew 1 tablet by mouth in the morning.     cetirizine (ZYRTEC) 10 MG tablet Take 10 mg by mouth at bedtime.     Cholecalciferol (VITAMIN D3 PO) Place 1 drop into both eyes in the morning.     CYANOCOBALAMIN PO Take 250 mcg by mouth every evening.     denosumab (PROLIA) 60 MG/ML SOSY injection Inject 60 mg into the skin every 6 (six) months.     famotidine (PEPCID) 20 MG tablet Take 20 mg by mouth 2 (two) times daily.     levothyroxine  (SYNTHROID) 50 MCG tablet Take 50 mcg by mouth daily before breakfast.     lovastatin (MEVACOR) 20 MG tablet Take 20 mg by mouth every Monday at 6 PM.     Multiple Vitamin (MULTIVITAMIN WITH MINERALS) TABS tablet Take 1 tablet by mouth in the morning and at bedtime.     Multiple Vitamins-Minerals (OCUVITE EXTRA PO) Take 1 tablet by mouth at bedtime.     ondansetron (ZOFRAN) 4 MG tablet Take 4 mg by mouth daily as needed.     ondansetron (ZOFRAN-ODT) 4 MG disintegrating tablet Take 1 tablet (4 mg total) by mouth every 6 (six) hours as needed for nausea or vomiting. 30 tablet 1   pantoprazole (PROTONIX) 40 MG tablet Take 40 mg by mouth every morning.     sucralfate (CARAFATE) 1 g tablet Take 1 tablet (1 g total) by mouth every 6 (six) hours. You can dissolve 1 tablet in a TBSP of distilled water and drink as a slurry 120 tablet 1   SUMAtriptan (IMITREX) 100 MG tablet TAKE 1 TABLET BY MOUTH ONCE AS NEEDED FOR MIGRAINE(MAY REPEAT IN 2 HORUS. MAX OF 2 TABLETS PER 24 HOURS)     triamterene-hydrochlorothiazide (MAXZIDE-25) 37.5-25 MG tablet Take 1 tablet by mouth every morning.     Vitamin D, Ergocalciferol, (DRISDOL) 50000 UNITS CAPS Take 50,000 Units by mouth See admin instructions. Take 1 capsule twice daily on Mondays through Fridays.     vitamin E 180 MG (400 UNITS) capsule Take 400 Units by mouth at bedtime.     vortioxetine HBr (TRINTELLIX) 20 MG TABS tablet Take 20 mg by mouth in the morning.     clonazePAM (KLONOPIN) 0.5 MG tablet Take 0.5 mg by mouth 2 (two) times daily as needed for anxiety.     propranolol (INDERAL) 10 MG tablet Take 1 tablet by mouth daily as needed (difficulty driving over bridges).  12   tirzepatide (MOUNJARO) 2.5 MG/0.5ML Pen Inject 2.5 mg into the skin once a week. (Patient not taking: Reported on 06/10/2023) 2 mL 5   tirzepatide (MOUNJARO) 5 MG/0.5ML Pen Inject 5 mg into the skin once a week. (Patient not taking: Reported on 06/10/2023) 2 mL 11   tirzepatide (MOUNJARO) 7.5  MG/0.5ML Pen Inject 7.5 mg into the skin once a week. 2 mL 3   Current Facility-Administered Medications  Medication  Dose Route Frequency Provider Last Rate Last Admin   0.9 %  sodium chloride infusion  500 mL Intravenous Once Jemarcus Dougal, Willaim Rayas, MD        Allergies as of 06/10/2023 - Review Complete 06/10/2023  Allergen Reaction Noted   Albertsons diphedryl [diphenhydramine] Other (See Comments) 04/02/2015   Atorvastatin Other (See Comments) 04/02/2015   Adhesive [tape] Rash 02/21/2013    Family History  Problem Relation Age of Onset   Hyperlipidemia Mother    Prostate cancer Father 68   Thyroid disease Father    Melanoma Brother 35       back of knee    Stroke Maternal Grandmother    Colon cancer Maternal Grandfather 60       colon caner   Pancreatic cancer Paternal Grandmother            Breast cancer Neg Hx    Esophageal cancer Neg Hx    Stomach cancer Neg Hx    Rectal cancer Neg Hx    Inflammatory bowel disease Neg Hx    Liver disease Neg Hx     Social History   Socioeconomic History   Marital status: Single    Spouse name: Not on file   Number of children: 0   Years of education: 20   Highest education level: Not on file  Occupational History    Comment: works for [employer]  Tobacco Use   Smoking status: Never   Smokeless tobacco: Never  Vaping Use   Vaping status: Never Used  Substance and Sexual Activity   Alcohol use: No   Drug use: No   Sexual activity: Not Currently    Birth control/protection: Post-menopausal  Other Topics Concern   Not on file  Social History Narrative   Lives alone   Caffeine- coffee, 2-3 cups daily, soda 1 daily   Social Determinants of Health   Financial Resource Strain: Not on file  Food Insecurity: Not on file  Transportation Needs: Not on file  Physical Activity: Not on file  Stress: Not on file  Social Connections: Not on file  Intimate Partner Violence: Not on file    Review of Systems: All other review of  systems negative except as mentioned in the HPI.   Lab Results  Component Value Date   WBC 7.4 03/12/2023   HGB 13.1 03/12/2023   HCT 39.7 03/12/2023   MCV 96.9 03/12/2023   PLT 243.0 03/12/2023    Lab Results  Component Value Date   NA 139 09/22/2022   CL 104 09/22/2022   K 4.8 09/22/2022   CO2 30 09/22/2022   BUN 11 09/22/2022   CREATININE 0.73 09/22/2022   GFR 91.28 09/22/2022   CALCIUM 8.1 (L) 09/22/2022   ALBUMIN 3.7 03/12/2023   GLUCOSE 93 09/22/2022    Lab Results  Component Value Date   ALT 38 (H) 03/12/2023   AST 26 03/12/2023   ALKPHOS 79 03/12/2023   BILITOT 0.3 03/12/2023     Physical Exam: Vital signs BP 112/60   Pulse 75   Temp (!) 96.2 F (35.7 C)   Resp 17   Ht 5\' 4"  (1.626 m)   Wt 146 lb 9.6 oz (66.5 kg)   LMP 09/14/2010   SpO2 100%   BMI 25.16 kg/m   General:   Alert,  Well-developed, pleasant and cooperative in NAD Lungs:  Clear throughout to auscultation.   Heart:  Regular rate and rhythm Abdomen:  Soft, nontender and nondistended.   Neuro/Psych:  Alert and cooperative. Normal mood and affect. A and O x 3  Harlin Rain, MD [facility]

## 2023-06-10 NOTE — Progress Notes (Signed)
Called to room to assist during endoscopic procedure.  Patient ID and intended procedure confirmed with present staff. Received instructions for my participation in the procedure from the performing physician.  

## 2023-06-10 NOTE — Patient Instructions (Addendum)
Resume previous diet Continue present medications, pick up new Rx for omeprazole, open capsules to take 40 mg twice daily 30 minutes before breakfast and 30 minutes before dinner Await pathology results  Handouts/information given for [place] ring  YOU HAD AN ENDOSCOPIC PROCEDURE TODAY AT THE [facility]:   Refer to the procedure report that was given to you for any specific questions about what was found during the examination.  If the procedure report does not answer your questions, please call your gastroenterologist to clarify.  If you requested that your care partner not be given the details of your procedure findings, then the procedure report has been included in a sealed envelope for you to review at your convenience later.  YOU SHOULD EXPECT: Some feelings of bloating in the abdomen. Passage of more gas than usual.  Walking can help get rid of the air that was put into your GI tract during the procedure and reduce the bloating. If you had a lower endoscopy (such as a colonoscopy or flexible sigmoidoscopy) you may notice spotting of blood in your stool or on the toilet paper. If you underwent a bowel prep for your procedure, you may not have a normal bowel movement for a few days.  Please Note:  You might notice some irritation and congestion in your nose or some drainage.  This is from the oxygen used during your procedure.  There is no need for concern and it should clear up in a day or so.  SYMPTOMS TO REPORT IMMEDIATELY:  Following upper endoscopy (EGD)  Vomiting of blood or coffee ground material  New chest pain or pain under the shoulder blades  Painful or persistently difficult swallowing  New shortness of breath  Fever of 100F or higher  Black, tarry-looking stools  For urgent or emergent issues, a gastroenterologist can be reached at any hour by calling [phone]. Do not use MyChart messaging for urgent concerns.   DIET:  We do recommend a small meal at  first, but then you may proceed to your regular diet.  Drink plenty of fluids but you should avoid alcoholic beverages for 24 hours.  ACTIVITY:  You should plan to take it easy for the rest of today and you should NOT DRIVE or use heavy machinery until tomorrow (because of the sedation medicines used during the test).    FOLLOW UP: Our staff will call the number listed on your records the next business day following your procedure.  We will call around 7:15- 8:00 am to check on you and address any questions or concerns that you may have regarding the information given to you following your procedure. If we do not reach you, we will leave a message.     If any biopsies were taken you will be contacted by phone or by letter within the next 1-3 weeks.  Please call us at [phone] if you have not heard about the biopsies in 3 weeks.    SIGNATURES/CONFIDENTIALITY: You and/or your care partner have signed paperwork which will be entered into your electronic medical record.  These signatures attest to the fact that that the information above on your After Visit Summary has been reviewed and is understood.  Full responsibility of the confidentiality of this discharge information lies with you and/or your care-partner.

## 2023-06-11 ENCOUNTER — Telehealth: Payer: Self-pay

## 2023-06-11 NOTE — Telephone Encounter (Signed)
  Follow up Call-     06/10/2023    2:58 PM 07/31/2022    7:46 AM  Call back number  Post procedure Call Back phone  # [phone]  Permission to leave phone message Yes Yes     Patient questions:  Do you have a fever, pain , or abdominal swelling? No. Pain Score  0 *  Have you tolerated food without any problems? Yes.    Have you been able to return to your normal activities? Yes.    Do you have any questions about your discharge instructions: Diet   No. Medications  No. Follow up visit  No.  Do you have questions or concerns about your Care? No.  Actions: * If pain score is 4 or above: No action needed, pain <4.

## 2023-06-14 ENCOUNTER — Encounter: Payer: Self-pay | Admitting: Gastroenterology

## 2023-06-15 ENCOUNTER — Other Ambulatory Visit: Payer: Self-pay

## 2023-06-15 ENCOUNTER — Other Ambulatory Visit (HOSPITAL_COMMUNITY): Payer: Self-pay

## 2023-06-15 MED ORDER — OMEPRAZOLE 40 MG PO CPDR: 40.0000 mg | DELAYED_RELEASE_CAPSULE | Freq: Two times a day (BID) | ORAL | Status: DC

## 2023-06-15 MED ORDER — MOUNJARO 10 MG/0.5ML ~~LOC~~ SOAJ
10.0000 mg | SUBCUTANEOUS | 1 refills | Status: DC
  Filled 2023-06-15 – 2023-06-25 (×2): qty 2, 28d supply, fill #0
  Filled 2023-08-23: qty 2, 28d supply, fill #1

## 2023-06-16 ENCOUNTER — Encounter (HOSPITAL_COMMUNITY): Payer: Self-pay

## 2023-06-16 LAB — SURGICAL PATHOLOGY

## 2023-06-17 ENCOUNTER — Other Ambulatory Visit (HOSPITAL_COMMUNITY): Payer: Self-pay

## 2023-06-25 ENCOUNTER — Other Ambulatory Visit (HOSPITAL_COMMUNITY): Payer: Self-pay

## 2023-06-30 ENCOUNTER — Encounter (AMBULATORY_SURGERY_CENTER): Payer: BC Managed Care – PPO | Admitting: Gastroenterology

## 2023-06-30 DIAGNOSIS — K219 Gastro-esophageal reflux disease without esophagitis: Secondary | ICD-10-CM | POA: Diagnosis not present

## 2023-07-01 NOTE — Telephone Encounter (Signed)
Please see the MyChart message reply(ies) for my assessment and plan.    This patient gave consent for this Medical Advice Message and is aware that it may result in a bill to [employer], as well as the possibility of receiving a bill for a co-payment or deductible. They are an established patient, but are not seeking medical advice exclusively about a problem treated during an in person or video visit in the last seven days. I did not recommend an in person or video visit within seven days of my reply.    I spent a total of 12 minutes cumulative time within 7 days through [employer].  Benancio Deeds, MD

## 2023-08-03 NOTE — Progress Notes (Signed)
Sent message, via epic in basket, requesting orders in epic from surgeon.  

## 2023-08-04 ENCOUNTER — Ambulatory Visit: Payer: Self-pay | Admitting: Surgery

## 2023-08-04 DIAGNOSIS — Z01818 Encounter for other preprocedural examination: Secondary | ICD-10-CM

## 2023-08-06 NOTE — Progress Notes (Signed)
Anesthesia Review:  PCP: Cardiologist : Chest x-ray : EKG : Echo : Stress test: Cardiac Cath :  Activity level:  Sleep Study/ CPAP : Fasting Blood Sugar :      / Checks Blood Sugar -- times a day:   Blood Thinner/ Instructions /Last Dose: ASA / Instructions/ Last Dose :    Mounjaro-

## 2023-08-06 NOTE — Patient Instructions (Signed)
SURGICAL WAITING ROOM VISITATION  Patients having surgery or a procedure may have no more than 2 support people in the waiting area - these visitors may rotate.    Children under the age of 71 must have an adult with them who is not the patient.  Due to an increase in RSV and influenza rates and associated hospitalizations, children ages 57 and under may not visit patients in [facility] hospitals.  If the patient needs to stay at the hospital during part of their recovery, the visitor guidelines for inpatient rooms apply. Pre-op nurse will coordinate an appropriate time for 1 support person to accompany patient in pre-op.  This support person may not rotate.    Please refer to the [facility] website for the visitor guidelines for Inpatients (after your surgery is over and you are in a regular room).       Your procedure is scheduled on:  08/19/2023    Report to [facility] Main Entrance    Report to admitting at    1145AM   Call this number if you have problems the morning of surgery [phone]   Clear liquid diet on day of bowel prep.              Follow bowel prep instructons per MD.     After Midnight you may have the following liquids until __ 1045____ AM DAY OF SURGERY  Water Non-Citrus Juices (without pulp, NO RED-Apple, White grape, White cranberry) Black Coffee (NO MILK/CREAM OR CREAMERS, sugar ok)  Clear Tea (NO MILK/CREAM OR CREAMERS, sugar ok) regular and decaf                             Plain Jell-O (NO RED)                                           Fruit ices (not with fruit pulp, NO RED)                                     Popsicles (NO RED)                                                               Sports drinks like Gatorade (NO RED)              Drink 2 Ensure/G2 drinks AT 10:00 PM the night before surgery.        The day of surgery:  Drink ONE (1) Pre-Surgery Clear Ensure or G2 at 1045 AM  ( have completed by ) the morning of surgery. Drink  in one sitting. Do not sip.  This drink was given to you during your hospital  pre-op appointment visit. Nothing else to drink after completing the  Pre-Surgery Clear Ensure or G2.          If you have questions, please contact your surgeon's office.   FOLLOW BOWEL PREP AND ANY ADDITIONAL PRE OP INSTRUCTIONS YOU RECEIVED FROM YOUR SURGEON'S OFFICE!!!     Oral Hygiene is also important to  reduce your risk of infection.                                    Remember - BRUSH YOUR TEETH THE MORNING OF SURGERY WITH YOUR REGULAR TOOTHPASTE  DENTURES WILL BE REMOVED PRIOR TO SURGERY PLEASE DO NOT APPLY "Poly grip" OR ADHESIVES!!!   Do NOT smoke after Midnight   Stop all vitamins and herbal supplements 7 days before surgery.   Take these medicines the morning of surgery with A SIP OF WATER:  abilify, wellbutrin, pepcid, synthroid, omeprazole               Mounjaro- hold for 7 days prior to surgery.  Last dose on   DO NOT TAKE ANY ORAL DIABETIC MEDICATIONS DAY OF YOUR SURGERY  Bring CPAP mask and tubing day of surgery.                              You may not have any metal on your body including hair pins, jewelry, and body piercing             Do not wear make-up, lotions, powders, perfumes/cologne, or deodorant  Do not wear nail polish including gel and S&S, artificial/acrylic nails, or any other type of covering on natural nails including finger and toenails. If you have artificial nails, gel coating, etc. that needs to be removed by a nail salon please have this removed prior to surgery or surgery may need to be canceled/ delayed if the surgeon/ anesthesia feels like they are unable to be safely monitored.   Do not shave  48 hours prior to surgery.               Men may shave face and neck.   Do not bring valuables to the hospital. [facility] IS NOT             RESPONSIBLE   FOR VALUABLES.   Contacts, glasses, dentures or bridgework may not be worn into surgery.   Bring small  overnight bag day of surgery.   DO NOT BRING YOUR HOME MEDICATIONS TO THE HOSPITAL. PHARMACY WILL DISPENSE MEDICATIONS LISTED ON YOUR MEDICATION LIST TO YOU DURING YOUR ADMISSION IN THE HOSPITAL!    Patients discharged on the day of surgery will not be allowed to drive home.  Someone NEEDS to stay with you for the first 24 hours after anesthesia.   Special Instructions: Bring a copy of your healthcare power of attorney and living will documents the day of surgery if you haven't scanned them before.              Please read over the following fact sheets you were given: IF YOU HAVE QUESTIONS ABOUT YOUR PRE-OP INSTRUCTIONS PLEASE CALL [phone]   If you received a COVID test during your pre-op visit  it is requested that you wear a mask when out in public, stay away from anyone that may not be feeling well and notify your surgeon if you develop symptoms. If you test positive for Covid or have been in contact with anyone that has tested positive in the last 10 days please notify you surgeon.    [facility] - Preparing for Surgery Before surgery, you can play an important role.  Because skin is not sterile, your skin needs to be as free of germs as possible.  You can reduce the number of germs on your skin by washing with CHG (chlorahexidine gluconate) soap before surgery.  CHG is an antiseptic cleaner which kills germs and bonds with the skin to continue killing germs even after washing. Please DO NOT use if you have an allergy to CHG or antibacterial soaps.  If your skin becomes reddened/irritated stop using the CHG and inform your nurse when you arrive at Short Stay. Do not shave (including legs and underarms) for at least 48 hours prior to the first CHG shower.  You may shave your face/neck. Please follow these instructions carefully:  1.  Shower with CHG Soap the night before surgery and the  morning of Surgery.  2.  If you choose to wash your hair, wash your hair first as usual with your   normal  shampoo.  3.  After you shampoo, rinse your hair and body thoroughly to remove the  shampoo.                           4.  Use CHG as you would any other liquid soap.  You can apply chg directly  to the skin and wash                       Gently with a scrungie or clean washcloth.  5.  Apply the CHG Soap to your body ONLY FROM THE NECK DOWN.   Do not use on face/ open                           Wound or open sores. Avoid contact with eyes, ears mouth and genitals (private parts).                       Wash face,  Genitals (private parts) with your normal soap.             6.  Wash thoroughly, paying special attention to the area where your surgery  will be performed.  7.  Thoroughly rinse your body with warm water from the neck down.  8.  DO NOT shower/wash with your normal soap after using and rinsing off  the CHG Soap.                9.  Pat yourself dry with a clean towel.            10.  Wear clean pajamas.            11.  Place clean sheets on your bed the night of your first shower and do not  sleep with pets. Day of Surgery : Do not apply any lotions/deodorants the morning of surgery.  Please wear clean clothes to the hospital/surgery center.  FAILURE TO FOLLOW THESE INSTRUCTIONS MAY RESULT IN THE CANCELLATION OF YOUR SURGERY PATIENT SIGNATURE_________________________________  NURSE SIGNATURE__________________________________  ________________________________________________________________________[facility] - Preparing for Surgery Before surgery, you can play an important role.  Because skin is not sterile, your skin needs to be as free of germs as possible.  You can reduce the number of germs on your skin by washing with CHG (chlorahexidine gluconate) soap before surgery.  CHG is an antiseptic cleaner which kills germs and bonds with the skin to continue killing germs even after washing. Please DO NOT use if you have an allergy to CHG or antibacterial soaps.  If your skin  becomes reddened/irritated stop using the CHG and inform your nurse when you arrive at Short Stay. Do not shave (including legs and underarms) for at least 48 hours prior to the first CHG shower.  You may shave your face/neck. Please follow these instructions carefully:  1.  Shower with CHG Soap the night before surgery and the  morning of Surgery.  2.  If you choose to wash your hair, wash your hair first as usual with your  normal  shampoo.  3.  After you shampoo, rinse your hair and body thoroughly to remove the  shampoo.                           4.  Use CHG as you would any other liquid soap.  You can apply chg directly  to the skin and wash                       Gently with a scrungie or clean washcloth.  5.  Apply the CHG Soap to your body ONLY FROM THE NECK DOWN.   Do not use on face/ open                           Wound or open sores. Avoid contact with eyes, ears mouth and genitals (private parts).                       Wash face,  Genitals (private parts) with your normal soap.             6.  Wash thoroughly, paying special attention to the area where your surgery  will be performed.  7.  Thoroughly rinse your body with warm water from the neck down.  8.  DO NOT shower/wash with your normal soap after using and rinsing off  the CHG Soap.                9.  Pat yourself dry with a clean towel.            10.  Wear clean pajamas.            11.  Place clean sheets on your bed the night of your first shower and do not  sleep with pets. Day of Surgery : Do not apply any lotions/deodorants the morning of surgery.  Please wear clean clothes to the hospital/surgery center.  FAILURE TO FOLLOW THESE INSTRUCTIONS MAY RESULT IN THE CANCELLATION OF YOUR SURGERY PATIENT SIGNATURE_________________________________  NURSE SIGNATURE__________________________________  ________________________________________________________________________

## 2023-08-10 ENCOUNTER — Encounter (HOSPITAL_COMMUNITY)
Admission: RE | Admit: 2023-08-10 | Discharge: 2023-08-10 | Disposition: A | Discharge status: Home / Self Care | Payer: BC Managed Care – PPO | Source: Ambulatory Visit | Attending: Surgery

## 2023-08-10 ENCOUNTER — Other Ambulatory Visit: Payer: Self-pay

## 2023-08-10 ENCOUNTER — Encounter (HOSPITAL_COMMUNITY): Payer: Self-pay

## 2023-08-10 DIAGNOSIS — Z01818 Encounter for other preprocedural examination: Secondary | ICD-10-CM | POA: Diagnosis present

## 2023-08-10 HISTORY — DX: Hypothyroidism, unspecified: E03.9

## 2023-08-10 LAB — CBC WITH DIFFERENTIAL/PLATELET
Abs Immature Granulocytes: 0.02 10*3/uL (ref 0.00–0.07)
Basophils Absolute: 0.1 10*3/uL (ref 0.0–0.1)
Basophils Relative: 1 %
Eosinophils Absolute: 0.1 10*3/uL (ref 0.0–0.5)
Eosinophils Relative: 2 %
HCT: 38.2 % (ref 36.0–46.0)
Hemoglobin: 12.9 g/dL (ref 12.0–15.0)
Immature Granulocytes: 0 %
Lymphocytes Relative: 36 %
Lymphs Abs: 1.9 10*3/uL (ref 0.7–4.0)
MCH: 32.7 pg (ref 26.0–34.0)
MCHC: 33.8 g/dL (ref 30.0–36.0)
MCV: 96.7 fL (ref 80.0–100.0)
Monocytes Absolute: 0.4 10*3/uL (ref 0.1–1.0)
Monocytes Relative: 8 %
Neutro Abs: 2.8 10*3/uL (ref 1.7–7.7)
Neutrophils Relative %: 53 %
Platelets: 213 10*3/uL (ref 150–400)
RBC: 3.95 MIL/uL (ref 3.87–5.11)
RDW: 12.2 % (ref 11.5–15.5)
WBC: 5.2 10*3/uL (ref 4.0–10.5)
nRBC: 0 % (ref 0.0–0.2)

## 2023-08-10 LAB — COMPREHENSIVE METABOLIC PANEL
ALT: 32 U/L (ref 0–44)
AST: 33 U/L (ref 15–41)
Albumin: 3.6 g/dL (ref 3.5–5.0)
Alkaline Phosphatase: 73 U/L (ref 38–126)
Anion gap: 9 (ref 5–15)
BUN: 11 mg/dL (ref 6–20)
CO2: 25 mmol/L (ref 22–32)
Calcium: 8.8 mg/dL — ABNORMAL LOW (ref 8.9–10.3)
Chloride: 104 mmol/L (ref 98–111)
Creatinine, Ser: 0.94 mg/dL (ref 0.44–1.00)
GFR, Estimated: >60 mL/min (ref >60)
Glucose, Bld: 96 mg/dL (ref 70–99)
Potassium: 4.1 mmol/L (ref 3.5–5.1)
Sodium: 138 mmol/L (ref 135–145)
Total Bilirubin: 0.6 mg/dL (ref <1.2)
Total Protein: 6.1 g/dL — ABNORMAL LOW (ref 6.5–8.1)

## 2023-08-19 ENCOUNTER — Encounter (HOSPITAL_COMMUNITY)
Admission: RE | Disposition: A | Discharge status: Home / Self Care | Payer: Self-pay | Source: Home / Self Care | Attending: Surgery

## 2023-08-19 ENCOUNTER — Encounter (HOSPITAL_COMMUNITY): Payer: Self-pay | Admitting: Surgery

## 2023-08-19 ENCOUNTER — Inpatient Hospital Stay (HOSPITAL_COMMUNITY)
Admission: RE | Admit: 2023-08-19 | Discharge: 2023-08-21 | DRG: 331 | Disposition: A | Discharge status: Home / Self Care | Payer: BC Managed Care – PPO | Attending: Surgery | Admitting: Surgery

## 2023-08-19 ENCOUNTER — Inpatient Hospital Stay (HOSPITAL_COMMUNITY): Payer: BC Managed Care – PPO | Admitting: Anesthesiology

## 2023-08-19 ENCOUNTER — Inpatient Hospital Stay (HOSPITAL_COMMUNITY): Payer: BC Managed Care – PPO | Admitting: Physician Assistant

## 2023-08-19 ENCOUNTER — Other Ambulatory Visit: Payer: Self-pay

## 2023-08-19 DIAGNOSIS — M810 Age-related osteoporosis without current pathological fracture: Secondary | ICD-10-CM | POA: Diagnosis present

## 2023-08-19 DIAGNOSIS — K219 Gastro-esophageal reflux disease without esophagitis: Secondary | ICD-10-CM | POA: Diagnosis present

## 2023-08-19 DIAGNOSIS — E669 Obesity, unspecified: Secondary | ICD-10-CM | POA: Diagnosis present

## 2023-08-19 DIAGNOSIS — D123 Benign neoplasm of transverse colon: Principal | ICD-10-CM | POA: Diagnosis present

## 2023-08-19 DIAGNOSIS — Z823 Family history of stroke: Secondary | ICD-10-CM | POA: Diagnosis not present

## 2023-08-19 DIAGNOSIS — E039 Hypothyroidism, unspecified: Secondary | ICD-10-CM | POA: Diagnosis present

## 2023-08-19 DIAGNOSIS — Z9884 Bariatric surgery status: Secondary | ICD-10-CM

## 2023-08-19 DIAGNOSIS — Z01818 Encounter for other preprocedural examination: Secondary | ICD-10-CM

## 2023-08-19 DIAGNOSIS — F32A Depression, unspecified: Secondary | ICD-10-CM | POA: Diagnosis present

## 2023-08-19 DIAGNOSIS — M797 Fibromyalgia: Secondary | ICD-10-CM | POA: Diagnosis present

## 2023-08-19 DIAGNOSIS — Z800 Family history of malignant neoplasm of digestive organs: Secondary | ICD-10-CM

## 2023-08-19 DIAGNOSIS — E785 Hyperlipidemia, unspecified: Secondary | ICD-10-CM | POA: Diagnosis present

## 2023-08-19 DIAGNOSIS — Z83438 Family history of other disorder of lipoprotein metabolism and other lipidemia: Secondary | ICD-10-CM

## 2023-08-19 DIAGNOSIS — Z808 Family history of malignant neoplasm of other organs or systems: Secondary | ICD-10-CM

## 2023-08-19 DIAGNOSIS — K6389 Other specified diseases of intestine: Principal | ICD-10-CM | POA: Diagnosis present

## 2023-08-19 DIAGNOSIS — Z8349 Family history of other endocrine, nutritional and metabolic diseases: Secondary | ICD-10-CM

## 2023-08-19 DIAGNOSIS — Z9049 Acquired absence of other specified parts of digestive tract: Secondary | ICD-10-CM | POA: Diagnosis not present

## 2023-08-19 DIAGNOSIS — K660 Peritoneal adhesions (postprocedural) (postinfection): Secondary | ICD-10-CM | POA: Diagnosis present

## 2023-08-19 DIAGNOSIS — I10 Essential (primary) hypertension: Secondary | ICD-10-CM | POA: Diagnosis present

## 2023-08-19 DIAGNOSIS — Z888 Allergy status to other drugs, medicaments and biological substances status: Secondary | ICD-10-CM

## 2023-08-19 HISTORY — PX: LYSIS OF ADHESION: SHX5961

## 2023-08-19 LAB — ABO/RH: ABO/RH(D): A NEG

## 2023-08-19 LAB — TYPE AND SCREEN
ABO/RH(D): A NEG
Antibody Screen: NEGATIVE

## 2023-08-19 SURGERY — COLECTOMY, PARTIAL, ROBOT-ASSISTED, LAPAROSCOPIC
Anesthesia: General

## 2023-08-19 MED ORDER — OXYCODONE HCL 5 MG PO TABS: 5.0000 mg | ORAL_TABLET | Freq: Once | ORAL | Status: AC | PRN

## 2023-08-19 MED ORDER — SUCRALFATE 1 G PO TABS
1.0000 g | ORAL_TABLET | Freq: Four times a day (QID) | ORAL | Status: DC
  Administered 2023-08-19 – 2023-08-21 (×7): 1 g via ORAL
  Filled 2023-08-19 (×7): qty 1

## 2023-08-19 MED ORDER — DEXAMETHASONE SODIUM PHOSPHATE 10 MG/ML IJ SOLN
INTRAMUSCULAR | Status: DC | PRN
  Administered 2023-08-19: 10 mg via INTRAVENOUS

## 2023-08-19 MED ORDER — OXYCODONE HCL 5 MG PO TABS
ORAL_TABLET | ORAL | Status: AC
  Administered 2023-08-19: 5 mg via ORAL
  Filled 2023-08-19: qty 1

## 2023-08-19 MED ORDER — FENTANYL CITRATE (PF) 100 MCG/2ML IJ SOLN
INTRAMUSCULAR | Status: AC
  Filled 2023-08-19: qty 2

## 2023-08-19 MED ORDER — FENTANYL CITRATE PF 50 MCG/ML IJ SOSY
25.0000 ug | PREFILLED_SYRINGE | INTRAMUSCULAR | Status: DC | PRN
  Administered 2023-08-19: 25 ug via INTRAVENOUS

## 2023-08-19 MED ORDER — BUPIVACAINE-EPINEPHRINE (PF) 0.25% -1:200000 IJ SOLN
INTRAMUSCULAR | Status: DC | PRN
  Administered 2023-08-19: 30 mL

## 2023-08-19 MED ORDER — METRONIDAZOLE 500 MG PO TABS: 1000.0000 mg | ORAL_TABLET | ORAL | Status: DC

## 2023-08-19 MED ORDER — BUPIVACAINE LIPOSOME 1.3 % IJ SUSP
INTRAMUSCULAR | Status: AC
  Filled 2023-08-19: qty 20

## 2023-08-19 MED ORDER — ALBUMIN HUMAN 5 % IV SOLN
INTRAVENOUS | Status: AC
  Filled 2023-08-19: qty 250

## 2023-08-19 MED ORDER — ONDANSETRON HCL 4 MG/2ML IJ SOLN
INTRAMUSCULAR | Status: AC
  Filled 2023-08-19: qty 2

## 2023-08-19 MED ORDER — ENSURE PRE-SURGERY PO LIQD: 592.0000 mL | Freq: Once | ORAL | Status: DC

## 2023-08-19 MED ORDER — ALBUMIN HUMAN 5 % IV SOLN: INTRAVENOUS | Status: DC | PRN

## 2023-08-19 MED ORDER — BISACODYL 5 MG PO TBEC: 20.0000 mg | DELAYED_RELEASE_TABLET | Freq: Once | ORAL | Status: DC

## 2023-08-19 MED ORDER — NEOMYCIN SULFATE 500 MG PO TABS: 1000.0000 mg | ORAL_TABLET | ORAL | Status: DC

## 2023-08-19 MED ORDER — ARIPIPRAZOLE 5 MG PO TABS
2.5000 mg | ORAL_TABLET | Freq: Every day | ORAL | Status: DC
  Administered 2023-08-20 – 2023-08-21 (×2): 2.5 mg via ORAL
  Filled 2023-08-19 (×2): qty 1

## 2023-08-19 MED ORDER — PANTOPRAZOLE SODIUM 40 MG PO TBEC
40.0000 mg | DELAYED_RELEASE_TABLET | Freq: Two times a day (BID) | ORAL | Status: DC
  Administered 2023-08-20 – 2023-08-21 (×3): 40 mg via ORAL
  Filled 2023-08-19 (×3): qty 1

## 2023-08-19 MED ORDER — LIDOCAINE HCL 2 % IJ SOLN
INTRAMUSCULAR | Status: AC
  Filled 2023-08-19: qty 20

## 2023-08-19 MED ORDER — BUPIVACAINE LIPOSOME 1.3 % IJ SUSP
INTRAMUSCULAR | Status: DC | PRN
  Administered 2023-08-19: 20 mL

## 2023-08-19 MED ORDER — ALVIMOPAN 12 MG PO CAPS
12.0000 mg | ORAL_CAPSULE | Freq: Two times a day (BID) | ORAL | Status: DC
  Administered 2023-08-20 (×2): 12 mg via ORAL
  Filled 2023-08-19 (×2): qty 1

## 2023-08-19 MED ORDER — SODIUM CHLORIDE (PF) 0.9 % IJ SOLN
INTRAMUSCULAR | Status: AC
  Filled 2023-08-19: qty 20

## 2023-08-19 MED ORDER — KETAMINE HCL 10 MG/ML IJ SOLN
INTRAMUSCULAR | Status: DC | PRN
  Administered 2023-08-19: 30 mg via INTRAVENOUS

## 2023-08-19 MED ORDER — ORAL CARE MOUTH RINSE: 15.0000 mL | Freq: Once | OROMUCOSAL | Status: AC

## 2023-08-19 MED ORDER — OXYCODONE HCL 5 MG/5ML PO SOLN: 5.0000 mg | Freq: Once | ORAL | Status: AC | PRN

## 2023-08-19 MED ORDER — ALUM & MAG HYDROXIDE-SIMETH 200-200-20 MG/5ML PO SUSP
30.0000 mL | Freq: Four times a day (QID) | ORAL | Status: DC | PRN
Start: 2023-08-19 — End: 2023-08-21

## 2023-08-19 MED ORDER — ATROPINE SULFATE 1 MG/10ML IJ SOSY
PREFILLED_SYRINGE | INTRAMUSCULAR | Status: AC
  Filled 2023-08-19: qty 10

## 2023-08-19 MED ORDER — ATROPINE SULFATE 0.4 MG/ML IV SOSY
PREFILLED_SYRINGE | INTRAMUSCULAR | Status: DC | PRN
  Administered 2023-08-19: .4 mg via INTRAVENOUS

## 2023-08-19 MED ORDER — PHENYLEPHRINE 80 MCG/ML (10ML) SYRINGE FOR IV PUSH (FOR BLOOD PRESSURE SUPPORT)
PREFILLED_SYRINGE | INTRAVENOUS | Status: AC
  Filled 2023-08-19: qty 10

## 2023-08-19 MED ORDER — SODIUM CHLORIDE 0.9 % IV SOLN
2.0000 g | INTRAVENOUS | Status: AC
  Administered 2023-08-19: 2 g via INTRAVENOUS
  Filled 2023-08-19: qty 2

## 2023-08-19 MED ORDER — ACETAMINOPHEN 160 MG/5ML PO SOLN: 325.0000 mg | ORAL | Status: DC | PRN

## 2023-08-19 MED ORDER — HYDROMORPHONE HCL 1 MG/ML IJ SOLN
0.5000 mg | INTRAMUSCULAR | Status: DC | PRN
  Filled 2023-08-19: qty 0.5

## 2023-08-19 MED ORDER — POLYETHYLENE GLYCOL 3350 17 GM/SCOOP PO POWD: 1.0000 | Freq: Once | ORAL | Status: DC

## 2023-08-19 MED ORDER — IBUPROFEN 400 MG PO TABS
600.0000 mg | ORAL_TABLET | Freq: Four times a day (QID) | ORAL | Status: DC | PRN
  Administered 2023-08-20 – 2023-08-21 (×2): 600 mg via ORAL
  Filled 2023-08-19 (×5): qty 1

## 2023-08-19 MED ORDER — CYANOCOBALAMIN 500 MCG PO TABS
250.0000 ug | ORAL_TABLET | Freq: Every evening | ORAL | Status: DC
  Administered 2023-08-19 – 2023-08-20 (×2): 250 ug via ORAL
  Filled 2023-08-19 (×2): qty 1

## 2023-08-19 MED ORDER — CHLORHEXIDINE GLUCONATE 0.12 % MT SOLN
15.0000 mL | Freq: Once | OROMUCOSAL | Status: AC
  Administered 2023-08-19: 15 mL via OROMUCOSAL

## 2023-08-19 MED ORDER — ONDANSETRON HCL 4 MG PO TABS: 4.0000 mg | ORAL_TABLET | Freq: Four times a day (QID) | ORAL | Status: DC | PRN

## 2023-08-19 MED ORDER — LORATADINE 10 MG PO TABS
10.0000 mg | ORAL_TABLET | Freq: Every day | ORAL | Status: DC
  Administered 2023-08-19 – 2023-08-21 (×3): 10 mg via ORAL
  Filled 2023-08-19 (×3): qty 1

## 2023-08-19 MED ORDER — ROCURONIUM BROMIDE 10 MG/ML (PF) SYRINGE
PREFILLED_SYRINGE | INTRAVENOUS | Status: DC | PRN
  Administered 2023-08-19: 20 mg via INTRAVENOUS
  Administered 2023-08-19: 70 mg via INTRAVENOUS
  Administered 2023-08-19: 20 mg via INTRAVENOUS
  Administered 2023-08-19: 10 mg via INTRAVENOUS

## 2023-08-19 MED ORDER — CLONAZEPAM 0.5 MG PO TABS
0.5000 mg | ORAL_TABLET | Freq: Two times a day (BID) | ORAL | Status: DC | PRN
  Filled 2023-08-19: qty 1

## 2023-08-19 MED ORDER — 0.9 % SODIUM CHLORIDE (POUR BTL) OPTIME
TOPICAL | Status: DC | PRN
Start: 2023-08-19 — End: 2023-08-19
  Administered 2023-08-19: 1000 mL

## 2023-08-19 MED ORDER — FENTANYL CITRATE PF 50 MCG/ML IJ SOSY
PREFILLED_SYRINGE | INTRAMUSCULAR | Status: AC
  Administered 2023-08-19: 25 ug via INTRAVENOUS
  Filled 2023-08-19: qty 1

## 2023-08-19 MED ORDER — LIDOCAINE HCL (PF) 2 % IJ SOLN
INTRAMUSCULAR | Status: AC
  Filled 2023-08-19: qty 5

## 2023-08-19 MED ORDER — TRAMADOL HCL 50 MG PO TABS
50.0000 mg | ORAL_TABLET | Freq: Four times a day (QID) | ORAL | Status: DC | PRN
  Administered 2023-08-20 – 2023-08-21 (×5): 50 mg via ORAL
  Filled 2023-08-19 (×5): qty 1

## 2023-08-19 MED ORDER — DIPHENHYDRAMINE HCL 50 MG/ML IJ SOLN: 12.5000 mg | Freq: Four times a day (QID) | INTRAMUSCULAR | Status: DC | PRN

## 2023-08-19 MED ORDER — PRAVASTATIN SODIUM 20 MG PO TABS
20.0000 mg | ORAL_TABLET | Freq: Every day | ORAL | Status: DC
  Administered 2023-08-20: 20 mg via ORAL
  Filled 2023-08-19: qty 1

## 2023-08-19 MED ORDER — LEVOTHYROXINE SODIUM 50 MCG PO TABS
50.0000 ug | ORAL_TABLET | Freq: Every day | ORAL | Status: DC
  Administered 2023-08-20 – 2023-08-21 (×2): 50 ug via ORAL
  Filled 2023-08-19 (×2): qty 1

## 2023-08-19 MED ORDER — BUPIVACAINE LIPOSOME 1.3 % IJ SUSP: 20.0000 mL | Freq: Once | INTRAMUSCULAR | Status: DC

## 2023-08-19 MED ORDER — FENTANYL CITRATE (PF) 100 MCG/2ML IJ SOLN
INTRAMUSCULAR | Status: DC | PRN
  Administered 2023-08-19: 100 ug via INTRAVENOUS
  Administered 2023-08-19 (×2): 50 ug via INTRAVENOUS

## 2023-08-19 MED ORDER — DROPERIDOL 2.5 MG/ML IJ SOLN: 0.6250 mg | Freq: Once | INTRAMUSCULAR | Status: DC | PRN

## 2023-08-19 MED ORDER — MIDAZOLAM HCL 2 MG/2ML IJ SOLN
INTRAMUSCULAR | Status: DC | PRN
  Administered 2023-08-19: 2 mg via INTRAVENOUS

## 2023-08-19 MED ORDER — ENSURE PRE-SURGERY PO LIQD
296.0000 mL | Freq: Once | ORAL | Status: DC
Start: 2023-08-20 — End: 2023-08-19

## 2023-08-19 MED ORDER — SCOPOLAMINE 1 MG/3DAYS TD PT72
1.0000 | MEDICATED_PATCH | TRANSDERMAL | Status: DC
  Administered 2023-08-19: 1.5 mg via TRANSDERMAL
  Filled 2023-08-19: qty 1

## 2023-08-19 MED ORDER — HYDRALAZINE HCL 20 MG/ML IJ SOLN: 10.0000 mg | INTRAMUSCULAR | Status: DC | PRN

## 2023-08-19 MED ORDER — PHENYLEPHRINE HCL-NACL 20-0.9 MG/250ML-% IV SOLN
INTRAVENOUS | Status: DC | PRN
  Administered 2023-08-19: 50 ug/min via INTRAVENOUS

## 2023-08-19 MED ORDER — VORTIOXETINE HBR 5 MG PO TABS
20.0000 mg | ORAL_TABLET | Freq: Every day | ORAL | Status: DC
  Administered 2023-08-20 – 2023-08-21 (×2): 20 mg via ORAL
  Filled 2023-08-19 (×2): qty 4

## 2023-08-19 MED ORDER — MIDAZOLAM HCL 2 MG/2ML IJ SOLN
INTRAMUSCULAR | Status: AC
  Filled 2023-08-19: qty 2

## 2023-08-19 MED ORDER — DIPHENHYDRAMINE HCL 12.5 MG/5ML PO ELIX: 12.5000 mg | ORAL_SOLUTION | Freq: Four times a day (QID) | ORAL | Status: DC | PRN

## 2023-08-19 MED ORDER — LACTATED RINGERS IV SOLN: INTRAVENOUS | Status: AC

## 2023-08-19 MED ORDER — PHENYLEPHRINE 80 MCG/ML (10ML) SYRINGE FOR IV PUSH (FOR BLOOD PRESSURE SUPPORT)
PREFILLED_SYRINGE | INTRAVENOUS | Status: DC | PRN
  Administered 2023-08-19 (×2): 80 ug via INTRAVENOUS

## 2023-08-19 MED ORDER — BUPIVACAINE-EPINEPHRINE 0.25% -1:200000 IJ SOLN
INTRAMUSCULAR | Status: AC
  Filled 2023-08-19: qty 1

## 2023-08-19 MED ORDER — PROPOFOL 10 MG/ML IV BOLUS
INTRAVENOUS | Status: DC | PRN
  Administered 2023-08-19: 140 mg via INTRAVENOUS

## 2023-08-19 MED ORDER — SUGAMMADEX SODIUM 200 MG/2ML IV SOLN
INTRAVENOUS | Status: DC | PRN
  Administered 2023-08-19: 200 mg via INTRAVENOUS

## 2023-08-19 MED ORDER — HEPARIN SODIUM (PORCINE) 5000 UNIT/ML IJ SOLN
5000.0000 [IU] | Freq: Once | INTRAMUSCULAR | Status: AC
  Administered 2023-08-19: 5000 [IU] via SUBCUTANEOUS
  Filled 2023-08-19: qty 1

## 2023-08-19 MED ORDER — ONDANSETRON HCL 4 MG/2ML IJ SOLN
4.0000 mg | Freq: Four times a day (QID) | INTRAMUSCULAR | Status: DC | PRN
  Administered 2023-08-20: 4 mg via INTRAVENOUS
  Filled 2023-08-19: qty 2

## 2023-08-19 MED ORDER — ACETAMINOPHEN 500 MG PO TABS
1000.0000 mg | ORAL_TABLET | ORAL | Status: AC
  Administered 2023-08-19: 1000 mg via ORAL
  Filled 2023-08-19: qty 2

## 2023-08-19 MED ORDER — ENSURE SURGERY PO LIQD
237.0000 mL | Freq: Two times a day (BID) | ORAL | Status: DC
  Administered 2023-08-20: 237 mL via ORAL

## 2023-08-19 MED ORDER — CHLORHEXIDINE GLUCONATE CLOTH 2 % EX PADS: 6.0000 | MEDICATED_PAD | Freq: Once | CUTANEOUS | Status: DC

## 2023-08-19 MED ORDER — ROCURONIUM BROMIDE 10 MG/ML (PF) SYRINGE
PREFILLED_SYRINGE | INTRAVENOUS | Status: AC
  Filled 2023-08-19: qty 10

## 2023-08-19 MED ORDER — SIMETHICONE 80 MG PO CHEW
40.0000 mg | CHEWABLE_TABLET | Freq: Four times a day (QID) | ORAL | Status: DC | PRN
  Administered 2023-08-20 – 2023-08-21 (×2): 40 mg via ORAL
  Filled 2023-08-19 (×3): qty 1

## 2023-08-19 MED ORDER — LIDOCAINE 2% (20 MG/ML) 5 ML SYRINGE
INTRAMUSCULAR | Status: DC | PRN
  Administered 2023-08-19: 1.5 mg/kg/h via INTRAVENOUS
  Administered 2023-08-19: 100 mg via INTRAVENOUS

## 2023-08-19 MED ORDER — BUPROPION HCL ER (XL) 150 MG PO TB24
300.0000 mg | ORAL_TABLET | Freq: Every day | ORAL | Status: DC
  Administered 2023-08-20 – 2023-08-21 (×2): 300 mg via ORAL
  Filled 2023-08-19 (×2): qty 2

## 2023-08-19 MED ORDER — PROPOFOL 10 MG/ML IV BOLUS
INTRAVENOUS | Status: AC
  Filled 2023-08-19: qty 20

## 2023-08-19 MED ORDER — ACETAMINOPHEN 325 MG PO TABS: 325.0000 mg | ORAL_TABLET | ORAL | Status: DC | PRN

## 2023-08-19 MED ORDER — ONDANSETRON HCL 4 MG/2ML IJ SOLN
INTRAMUSCULAR | Status: DC | PRN
  Administered 2023-08-19: 4 mg via INTRAVENOUS

## 2023-08-19 MED ORDER — LACTATED RINGERS IV SOLN: INTRAVENOUS | Status: DC

## 2023-08-19 MED ORDER — ALVIMOPAN 12 MG PO CAPS
12.0000 mg | ORAL_CAPSULE | ORAL | Status: AC
  Administered 2023-08-19: 12 mg via ORAL
  Filled 2023-08-19: qty 1

## 2023-08-19 MED ORDER — FENTANYL CITRATE PF 50 MCG/ML IJ SOSY
PREFILLED_SYRINGE | INTRAMUSCULAR | Status: AC
  Filled 2023-08-19: qty 1

## 2023-08-19 MED ORDER — SUMATRIPTAN SUCCINATE 50 MG PO TABS: 100.0000 mg | ORAL_TABLET | Freq: Every day | ORAL | Status: DC | PRN

## 2023-08-19 MED ORDER — ACETAMINOPHEN 10 MG/ML IV SOLN: 1000.0000 mg | Freq: Once | INTRAVENOUS | Status: DC | PRN

## 2023-08-19 MED ORDER — TRIAMTERENE-HCTZ 37.5-25 MG PO TABS
1.0000 | ORAL_TABLET | Freq: Every morning | ORAL | Status: DC
  Administered 2023-08-20 – 2023-08-21 (×2): 1 via ORAL
  Filled 2023-08-19 (×2): qty 1

## 2023-08-19 MED ORDER — KETAMINE HCL 50 MG/5ML IJ SOSY
PREFILLED_SYRINGE | INTRAMUSCULAR | Status: AC
  Filled 2023-08-19: qty 5

## 2023-08-19 MED ORDER — HEPARIN SODIUM (PORCINE) 5000 UNIT/ML IJ SOLN
5000.0000 [IU] | Freq: Three times a day (TID) | INTRAMUSCULAR | Status: DC
  Administered 2023-08-20 – 2023-08-21 (×4): 5000 [IU] via SUBCUTANEOUS
  Filled 2023-08-19 (×4): qty 1

## 2023-08-19 MED ORDER — CALCIUM CARBONATE ANTACID 500 MG PO CHEW
1.0000 | CHEWABLE_TABLET | Freq: Every day | ORAL | Status: DC
  Administered 2023-08-20 – 2023-08-21 (×2): 200 mg via ORAL
  Filled 2023-08-19 (×2): qty 1

## 2023-08-19 MED ORDER — ACETAMINOPHEN 500 MG PO TABS
1000.0000 mg | ORAL_TABLET | Freq: Four times a day (QID) | ORAL | Status: DC
  Administered 2023-08-19 – 2023-08-21 (×6): 1000 mg via ORAL
  Filled 2023-08-19 (×7): qty 2

## 2023-08-19 SURGICAL SUPPLY — 104 items
APPLIER CLIP 5 13 M/L LIGAMAX5 (MISCELLANEOUS)
APPLIER CLIP ROT 10 11.4 M/L (STAPLE)
BAG COUNTER SPONGE SURGICOUNT (BAG) IMPLANT
BLADE EXTENDED COATED 6.5IN (ELECTRODE) ×1 IMPLANT
CANNULA REDUCER 12-8 DVNC XI (CANNULA) ×1 IMPLANT
CHLORAPREP W/TINT 26 (MISCELLANEOUS) ×1 IMPLANT
CLIP APPLIE 5 13 M/L LIGAMAX5 (MISCELLANEOUS) IMPLANT
CLIP APPLIE ROT 10 11.4 M/L (STAPLE) IMPLANT
CLIP LIGATING HEM O LOK PURPLE (MISCELLANEOUS) IMPLANT
CLIP LIGATING HEMO O LOK GREEN (MISCELLANEOUS) IMPLANT
COVER SURGICAL LIGHT HANDLE (MISCELLANEOUS) ×2 IMPLANT
COVER TIP SHEARS 8 DVNC (MISCELLANEOUS) ×1 IMPLANT
DEFOGGER SCOPE WARMER CLEARIFY (MISCELLANEOUS) ×1 IMPLANT
DERMABOND ADVANCED .7 DNX12 (GAUZE/BANDAGES/DRESSINGS) IMPLANT
DEVICE TROCAR PUNCTURE CLOSURE (ENDOMECHANICALS) IMPLANT
DRAIN CHANNEL 19F RND (DRAIN) ×1 IMPLANT
DRAPE ARM DVNC X/XI (DISPOSABLE) ×4 IMPLANT
DRAPE COLUMN DVNC XI (DISPOSABLE) ×1 IMPLANT
DRAPE SURG IRRIG POUCH 19X23 (DRAPES) ×1 IMPLANT
DRIVER NDL LRG 8 DVNC XI (INSTRUMENTS) ×1 IMPLANT
DRIVER NDLE LRG 8 DVNC XI (INSTRUMENTS) ×1
DRSG OPSITE POSTOP 4X10 (GAUZE/BANDAGES/DRESSINGS) IMPLANT
DRSG OPSITE POSTOP 4X6 (GAUZE/BANDAGES/DRESSINGS) IMPLANT
DRSG OPSITE POSTOP 4X8 (GAUZE/BANDAGES/DRESSINGS) IMPLANT
DRSG TEGADERM 2-3/8X2-3/4 SM (GAUZE/BANDAGES/DRESSINGS) ×5 IMPLANT
DRSG TEGADERM 4X4.75 (GAUZE/BANDAGES/DRESSINGS) ×1 IMPLANT
ELECT REM PT RETURN 15FT ADLT (MISCELLANEOUS) ×1 IMPLANT
ENDOLOOP SUT PDS II 0 18 (SUTURE) IMPLANT
EVACUATOR SILICONE 100CC (DRAIN) ×1 IMPLANT
GAUZE SPONGE 2X2 8PLY STRL LF (GAUZE/BANDAGES/DRESSINGS) ×1 IMPLANT
GAUZE SPONGE 4X4 12PLY STRL (GAUZE/BANDAGES/DRESSINGS) IMPLANT
GLOVE BIO SURGEON STRL SZ7.5 (GLOVE) ×3 IMPLANT
GLOVE INDICATOR 8.0 STRL GRN (GLOVE) ×3 IMPLANT
GOWN SRG XL LVL 4 BRTHBL STRL (GOWNS) ×1 IMPLANT
GOWN STRL REUS W/ TWL XL LVL3 (GOWN DISPOSABLE) ×5 IMPLANT
GRASPER SUT TROCAR 14GX15 (MISCELLANEOUS) IMPLANT
GRASPER TIP-UP FEN DVNC XI (INSTRUMENTS) ×1 IMPLANT
HOLDER FOLEY CATH W/STRAP (MISCELLANEOUS) ×1 IMPLANT
IRRIG SUCT STRYKERFLOW 2 WTIP (MISCELLANEOUS) ×1
IRRIGATION SUCT STRKRFLW 2 WTP (MISCELLANEOUS) ×1 IMPLANT
KIT PROCEDURE DVNC SI (MISCELLANEOUS) IMPLANT
KIT TURNOVER KIT A (KITS) IMPLANT
LIGASURE IMPACT 36 18CM CVD LR (INSTRUMENTS) IMPLANT
NDL INSUFFLATION 14GA 120MM (NEEDLE) ×1 IMPLANT
NEEDLE INSUFFLATION 14GA 120MM (NEEDLE) ×1
PACK CARDIOVASCULAR III (CUSTOM PROCEDURE TRAY) ×1 IMPLANT
PACK COLON (CUSTOM PROCEDURE TRAY) ×1 IMPLANT
PAD POSITIONING PINK XL (MISCELLANEOUS) ×1 IMPLANT
PENCIL SMOKE EVACUATOR (MISCELLANEOUS) IMPLANT
PROTECTOR NERVE ULNAR (MISCELLANEOUS) ×2 IMPLANT
RELOAD PROXIMATE 75MM BLUE (ENDOMECHANICALS) ×2
RELOAD STAPLE 45 3.5 BLU DVNC (STAPLE) IMPLANT
RELOAD STAPLE 45 4.3 GRN DVNC (STAPLE) IMPLANT
RELOAD STAPLE 60 3.5 BLU DVNC (STAPLE) IMPLANT
RELOAD STAPLE 60 4.3 GRN DVNC (STAPLE) IMPLANT
RELOAD STAPLE 75 3.8 BLU REG (ENDOMECHANICALS) IMPLANT
RETRACTOR WND ALEXIS 18 MED (MISCELLANEOUS) IMPLANT
RTRCTR WOUND ALEXIS 18CM MED (MISCELLANEOUS)
SCISSORS LAP 5X35 DISP (ENDOMECHANICALS) IMPLANT
SCISSORS MNPLR CVD DVNC XI (INSTRUMENTS) ×1 IMPLANT
SEAL UNIV 5-12 XI (MISCELLANEOUS) ×4 IMPLANT
SEALER VESSEL EXT DVNC XI (MISCELLANEOUS) ×1 IMPLANT
SLEEVE ADV FIXATION 5X100MM (TROCAR) IMPLANT
SOL ELECTROSURG ANTI STICK (MISCELLANEOUS) ×1
SOLUTION ELECTROSURG ANTI STCK (MISCELLANEOUS) ×1 IMPLANT
SPIKE FLUID TRANSFER (MISCELLANEOUS) ×1 IMPLANT
STAPLER 60 SUREFORM DVNC (STAPLE) IMPLANT
STAPLER 90 3.5 STAND SLIM (STAPLE) ×1
STAPLER 90 3.5 STD SLIM (STAPLE) IMPLANT
STAPLER ECHELON POWER CIR 29 (STAPLE) IMPLANT
STAPLER ECHELON POWER CIR 31 (STAPLE) IMPLANT
STAPLER PROXIMATE 75MM BLUE (STAPLE) IMPLANT
STAPLER RELOAD 3.5X45 BLU DVNC (STAPLE)
STAPLER RELOAD 3.5X60 BLU DVNC (STAPLE)
STAPLER RELOAD 4.3X45 GRN DVNC (STAPLE)
STAPLER RELOAD 4.3X60 GRN DVNC (STAPLE)
STOPCOCK 4 WAY LG BORE MALE ST (IV SETS) ×2 IMPLANT
SURGILUBE 2OZ TUBE FLIPTOP (MISCELLANEOUS) ×1 IMPLANT
SUT MNCRL AB 4-0 PS2 18 (SUTURE) ×1 IMPLANT
SUT PDS AB 1 CT1 27 (SUTURE) IMPLANT
SUT PDS AB 1 TP1 96 (SUTURE) IMPLANT
SUT PROLENE 0 CT 2 (SUTURE) IMPLANT
SUT PROLENE 2 0 KS (SUTURE) ×1 IMPLANT
SUT PROLENE 2 0 SH DA (SUTURE) IMPLANT
SUT SILK 2 0 SH CR/8 (SUTURE) IMPLANT
SUT SILK 2-0 18XBRD TIE 12 (SUTURE) IMPLANT
SUT SILK 3 0 SH CR/8 (SUTURE) ×1 IMPLANT
SUT SILK 3-0 18XBRD TIE 12 (SUTURE) ×1 IMPLANT
SUT V-LOC BARB 180 2/0GR6 GS22 (SUTURE)
SUT VIC AB 3-0 SH 18 (SUTURE) IMPLANT
SUT VIC AB 3-0 SH 27XBRD (SUTURE) IMPLANT
SUT VICRYL 0 UR6 27IN ABS (SUTURE) ×1 IMPLANT
SUTURE V-LC BRB 180 2/0GR6GS22 (SUTURE) IMPLANT
SYR 10ML LL (SYRINGE) ×1 IMPLANT
SYS LAPSCP GELPORT 120MM (MISCELLANEOUS)
SYS WOUND ALEXIS 18CM MED (MISCELLANEOUS) ×1
SYSTEM LAPSCP GELPORT 120MM (MISCELLANEOUS) IMPLANT
SYSTEM WOUND ALEXIS 18CM MED (MISCELLANEOUS) ×1 IMPLANT
TAPE UMBILICAL 1/8 X36 TWILL (MISCELLANEOUS) ×1 IMPLANT
TOWEL OR NON WOVEN STRL DISP B (DISPOSABLE) ×1 IMPLANT
TRAY FOLEY MTR SLVR 16FR STAT (SET/KITS/TRAYS/PACK) ×1 IMPLANT
TROCAR ADV FIXATION 5X100MM (TROCAR) ×1 IMPLANT
TUBING CONNECTING 10 (TUBING) ×3 IMPLANT
TUBING INSUFFLATION 10FT LAP (TUBING) ×1 IMPLANT

## 2023-08-19 NOTE — Transfer of Care (Signed)
Immediate Anesthesia Transfer of Care Note  Patient: Katherine Todd  Procedure(s) Performed: ROBOTIC ASSISTED PARTIAL COLECTOMY; TAKEDOWN OF SPLENIC FLEXURE; TAP BLOCK LYSIS OF ADHESION  Patient Location: PACU  Anesthesia Type:General  Level of Consciousness: awake, alert , and oriented  Airway & Oxygen Therapy: Patient Spontanous Breathing and Patient connected to face mask oxygen  Post-op Assessment: Report given to RN and Post -op Vital signs reviewed and stable  Post vital signs: Reviewed and stable  Last Vitals:  Vitals Value Taken Time  BP 119/90 08/19/23 1702  Temp    Pulse 91 08/19/23 1704  Resp 17 08/19/23 1704  SpO2 100 % 08/19/23 1704  Vitals shown include unfiled device data.  Last Pain:  Vitals:   08/19/23 1311  TempSrc:   PainSc: 0-No pain         Complications: No notable events documented.

## 2023-08-19 NOTE — Anesthesia Procedure Notes (Signed)
Procedure Name: Intubation Date/Time: 08/19/2023 2:12 PM  Performed by: Florene Route, CRNAPre-anesthesia Checklist: Patient identified, Emergency Drugs available, Suction available and Patient being monitored Patient Re-evaluated:Patient Re-evaluated prior to induction Oxygen Delivery Method: Circle system utilized Preoxygenation: Pre-oxygenation with 100% oxygen Induction Type: IV induction Ventilation: Mask ventilation without difficulty Laryngoscope Size: Miller and 2 Grade View: Grade I Tube type: Oral Tube size: 7.0 mm Number of attempts: 1 Airway Equipment and Method: Stylet Placement Confirmation: ETT inserted through vocal cords under direct vision, positive ETCO2 and breath sounds checked- equal and bilateral Secured at: 20 cm Tube secured with: Tape Dental Injury: Teeth and Oropharynx as per pre-operative assessment

## 2023-08-19 NOTE — Anesthesia Preprocedure Evaluation (Addendum)
Anesthesia Evaluation  Patient identified by MRN, date of birth, ID band Patient awake    Reviewed: Allergy & Precautions, NPO status , Patient's Chart, lab work & pertinent test results, reviewed documented beta blocker date and time   Airway Mallampati: II  TM Distance: >3 FB Neck ROM: Full    Dental  (+) Teeth Intact, Dental Advisory Given   Pulmonary sleep apnea    Pulmonary exam normal breath sounds clear to auscultation       Cardiovascular hypertension, Pt. on medications Normal cardiovascular exam Rhythm:Regular Rate:Normal     Neuro/Psych  Headaches PSYCHIATRIC DISORDERS Anxiety Depression       GI/Hepatic Neg liver ROS,GERD  Medicated and Controlled,,COLONIC MASS S/p gastric bypass   Endo/Other  Hypothyroidism    Renal/GU negative Renal ROS     Musculoskeletal  (+) Arthritis ,  Fibromyalgia -  Abdominal   Peds  Hematology  (+) Blood dyscrasia, anemia   Anesthesia Other Findings   Reproductive/Obstetrics                             Anesthesia Physical Anesthesia Plan  ASA: 2  Anesthesia Plan: General   Post-op Pain Management: Tylenol PO (pre-op)* and Toradol IV (intra-op)*   Induction: Intravenous  PONV Risk Score and Plan: 4 or greater and Ondansetron, Dexamethasone, Midazolam and Scopolamine patch - Pre-op  Airway Management Planned: Oral ETT  Additional Equipment: None  Intra-op Plan:   Post-operative Plan: Extubation in OR  Informed Consent: I have reviewed the patients History and Physical, chart, labs and discussed the procedure including the risks, benefits and alternatives for the proposed anesthesia with the patient or authorized representative who has indicated his/her understanding and acceptance.     Dental advisory given  Plan Discussed with: CRNA  Anesthesia Plan Comments:        Anesthesia Quick Evaluation

## 2023-08-19 NOTE — Op Note (Signed)
PATIENT: Katherine Todd  57 y.o. female  Patient Care Team: Katherine Fillers, MD as PCP - General (Internal Medicine)  PREOP DIAGNOSIS: Splenic flexure mass (plexiform fibromyxoma)  POSTOP DIAGNOSIS: Same  PROCEDURE:  1.  Robotic assisted partial colectomy 2.  Takedown of splenic flexure 3.  Robotic lysis of adhesion x 110 minutes 4.  Transversus abdominis plane blocks, bilateral  SURGEON: Katherine Olp, MD  ASSISTANT: Katherine Levee, MD  ANESTHESIA: General endotracheal  EBL: 50 mL Total I/O In: 1050 [I.V.:800; IV Piggyback:250] Out: 150 [Urine:100; Blood:50]  DRAINS: None  SPECIMEN: Splenic flexure, stitch distal  COUNTS: Sponge, needle and instrument counts were reported correct x2  FINDINGS:  Adhesions as expected and much of her upper abdomen primarily of omentum but also involving transverse colon to the abdominal wall related to her prior surgeries.  Tedious adhesiolysis was necessary to facilitate the procedure.  Visualized portions of the liver are normal in appearance.  Peritoneal surfaces are normal in appearance.  Tattoo in in the proximalmost descending colon and splenic flexure.  Splenic flexure was mobilized to facilitate partial colectomy.  A side-to-side colocolonic anastomosis was fashioned.  NARRATIVE:  The patient was identified & brought into the operating room, placed supine on the operating table and SCDs were applied to the lower extremities. General endotracheal anesthesia was induced. The patient was positioned supine with arms tucked. Antibiotics were administered. A foley catheter was placed under sterile conditions. Hair in the region of planned surgery was clipped. The abdomen was prepped and draped in a sterile fashion. A timeout was performed confirming our patient and plan.   Beginning with the extraction port, a supraumbilical incision was made and carried down to the midline fascia. This was then incised with electrocautery. The  peritoneum was identified and elevated between clamps and carefully opened sharply. A small Alexis wound protector with a cap and associated port was then placed. The abdomen was insufflated to 15 mmHg with Co2. A laparoscope was placed and camera inspection revealed no evidence of injury. Bilateral TAP blocks were then performed under laparoscopic visualization using a mixture of 0.25% marcaine with epinepherine + Exparel. 2 additional 8 mm ports were then placed under direct laparoscopic visualization -in the right hemiabdomen.  A 5 mm assist port was also placed in the right hemiabdomen under direct visualization. The abdomen was surveyed.  There were adhesions of omentum and transverse colon across the upper abdomen from prior surgery.  The liver was also pulled up and adherent to the abdominal wall.  The visualized surfaces of the liver are normal.  The visualized peritoneal surfaces are also normal. There were no signs of metastatic disease.  She was then positioned in reverse Trendelenburg with left side up.  The robot was then docked and I went to the console.  We began by tediously lysing adhesions consisting primarily of omentum from the abdominal wall.  Omental adhesions were also taken off the falciform.  There also omental adhesions throughout her left upper quadrant.  Given the numerous prior surgeries she has had we did this slowly and carefully to ensure we were nowhere near her gastrojejunostomy or bypass.  The stomach did not appear involved with these adhesions nor was there small bowel involved.  Transverse colon is adherent across the upper midline portion of the abdomen and this is also carefully taken down sharply.  Omental surfaces were inspected and verified to be hemostatic.  At this point, we are able to then place an additional 8 mm  robotic trocar in the right upper quadrant under direct visualization.  An additional robotic arm was then docked.  We began by evaluating the colon.  The  transverse colon is normal in appearance.  There is a tattoo evident at the level of the proximal descending colon.  There is also multiple adherent loops of colon around her splenic flexure that appear to be primarily related to prior surgery.  Sure adhesiolysis was then carefully carried out at this location to straighten the colon and facilitate the surgery.  After doing this, we began with mobilized the splenic flexure.  The omentum is elevated anteriorly and the colon is retracted caudad.  We began with the distal transverse colon and freed the omentum from it.  The splenic flexure was then partially mobilized from this approach.  We then retracted the descending colon medially.  The Katherine Todd line of Toldt was incised all the way down to the left lower quadrant.  The associated ascending colon and mesocolon are mobilized medially.  The splenic flexure was then fully mobilized from this approach.  In doing this, the splenic flexure reaches across the left abdomen and into the right upper quadrant.  There appears to be more than adequate reach for an extracorporeal type extraction anastomosis.  Again the tattoo is noted at the proximal aspect of the descending colon.  The abdomen is then reinspected.  We verified hemostasis.  The robot is then undocked.  I scrubbed back in.  The extraction site was then used to deliver the splenic flexure portion of the colon into the wound protector.  There is a palpable mass within proximity of the tattoo.  Going at least 5 cm distal to this, window was created the mesentery.  The colon was then divided with a GIA 75 blue load stapler.  The staples are inspected noted to be well-formed and hemostatic.  A Babcock clamp was placed on the descending colon stapling to assist in maintaining orientation.  We then worked back to our proximal point of planned transection.  We opted to include the left branch of the middle colic's for lymph node purposes.  A window was created in the  transverse mesocolon at this location.  The distal aspect of the transverse colon is then divided using a GIA 75 blue load stapler.  The staples are inspected noted to be well-formed and hemostatic.  The intervening mesentery is then carefully ligated and divided using the hand-held LigaSure device.  We again included the left branch of the middle colic's leaving the main branch of the middle colic in situ for blood flow purposes.  The specimen is oriented with a stitch on the distal staple line and passed off.  There is a palpable mass within this segment of the colon.  Attention is then turned to creating a colocolonic anastomosis.  Both limbs of the colon are inspected noted to be well-perfused with a palpable pulse in the mesentery going out to each respective staple line.  Windows are created in the colon proximally and distally along the antimesenteric tinea.  Orientation is confirmed such a there is no twisting of either limb of colon.  The mesentery is distracted free of the planned anastomosis.  A 75 mm GIA blue load stapler was then used to create a colocolonic anastomosis and a side-to-side fashion.  The staple line is inspected and noted to have well-formed staples and hemostatic.  The common colotomy is then elevated with Allis clamps.  It is closed using a TA  90 blue load stapler.  The staple line is inspected noted to be well-formed.  1 area of oozing on the staple line was controlled with a 3-0 silk U-stitch.  A 3-0 silk stitch is also placed at the apex of the anastomosis.  Both limbs were again palpated and we confirmed good blood flow going all the way out to the TA staple line.  The corners of each TA staple line are then "dunked" using 3-0 silk suture.  The anastomosis is palpated noted to be widely patent, at least 3 fingerbreadths in diameter.  This is then placed back into the abdomen in its anatomic configuration.  Omentum was brought down over this.  The abdomen was then irrigated  with sterile saline and hemostasis verified. The wound protector cap was replaced and CO2 reinsufflated. The ports were removed under direct visualization and the sites noted to be hemostatic. The Alexis wound protector was removed, counts were reported correct, and we switched to clean instruments, gowns and drapes.   The fascia was then closed using two running #1 PDS sutures.  The skin of all incision sites was closed with 4-0 monocryl subcuticular suture. Dermabond was placed on the port sites and a sterile dressing was placed over the abdominal incision. All counts were reported correct. She was then awakened from anesthesia and sent to the post anesthesia care unit in stable condition.   DISPOSITION: PACU in satisfactory condition

## 2023-08-19 NOTE — H&P (Signed)
CC: Here today for surgery  HPI: WILLENE DIMATTIA is an 57 y.o. female with history of GERD, HLD, whom is seen in the office today as a referral by Dr. 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 Adela Lank): - Two 3 to 4 mm polyps in the ascending colon, removed with a cold snare. Resected and retrieved. - One 5 mm polyp at the hepatic flexure, removed with a cold snare. Resected and retrieved. - One large polypoid lesion in the descending colon as outlined. This may be a benign / inflamed lipomatous lesion, will await biopsy results. Not removed today given bleeding risk. Tattooed. - Internal hemorrhoids. - Tortuous colon with significant looping.  PATH 1. Surgical [P], colon, hepatic flexure and ascending, polyp (3) - TUBULAR ADENOMA. - NO HIGH GRADE DYSPLASIA OR MALIGNANCY. - SEPARATE POLYPOID FRAGMENTS OF BENIGN COLONIC MUCOSA WITH LYMPHOID AGGREGATE 2. Surgical [P], descending colon lypoma - INFLAMMATORY POLYP  Lower cscope EUS 10/15/22 (Dr. Meridee Score) COLON Impression: - Hemorrhoids found on digital rectal exam. - There was significant looping of the colon. - Three 3 to 4 mm polyps at the hepatic flexure and in the ascending colon, removed with a cold snare. Resected and retrieved. - Polypoid lesion in the proximal descending colon. Complete removal was accomplished as noted above. Clips (MR conditional) were placed but unsuccessful to completely close the defect. 4 total Polyloops in place for cinching. Injected PuraStat. - Normal mucosa in the entire examined colon otherwise. - Non-bleeding non-thrombosed external and internal hemorrhoids.   EUS Impression: - A polypoid lesion was visualized endosonographically in the descending colon. A tissue diagnosis was obtained prior to this exam. This is suggestive of benign inflammatory changes. - Mucosal resection was performed.  Resection and retrieval were complete.  PATH FINAL MICROSCOPIC DIAGNOSIS:   A. COLON, ASCENDING, HEPATIC FLEXURE, POLYPECTOMY:  Sessile serrated adenoma (s) without cytologic dysplasia.   B. COLON, DESCENDING, POLYPECTOMY:  Polypoid benign spindle cell proliferation with myxoid stroma.  Negative for dysplasia or malignancy.  See comment.   COMMENT:  The ascending polyp shows a submucosal spindle cell proliferation with  myxoid stroma interspersed with delicate thin-walled vessels. The  spindle cells do not show significant atypia or mitotic activity and  there is no necrosis. This is a very rare lesion in the colon with very  few case reports in the literature some of which use the term plexiform  fibromyxoma and others use the term benign colonic myxoma.   Colonoscopy 04/15/23 (Dr. Meridee Score) - Preparation of the colon was inadequate even after copious lavage. - Hemorrhoids found on digital rectal exam. - There was significant looping of the colon. - Post mucosectomy scar in the proximal descending colon. This is adjacent to the previously placed tattoo. There is no longer any intraduodenal portion of the previous plexiform fibromyxoma, what we have left is subepithelial impression at that location. - One 3 mm polyp at the recto-sigmoid colon, removed with a cold snare. Resected and retrieved. - Non-bleeding non-thrombosed external and internal hemorrhoids.  PATH D. COLON, SIGMOID, POLYPECTOMY:  - Hyperplastic polyp.  - No dysplasia or malignancy.   CT A/P 08/26/22 IMPRESSION: 5.6 cm low-attenuation intraluminal mass in proximal descending colon, without internal fat or other specific characteristics. Mucinous neoplasm and malignancy cannot be excluded.  Short-segment small bowel intussusception in the pelvis. These are typically idiopathic and self-limited. Consider short-term imaging follow-up with CT enterography.  No evidence of metastatic disease within  the abdomen or  pelvis.  CT Enterography 11/05/22 IMPRESSION: Previously seen small bowel intussusception in the pelvis is no longer visualized.  Decreased size of 3.4 cm intraluminal mass along the medial wall of the proximal descending colon. This may be due to partial resection; recommend correlation with recent procedure history.  No evidence of abdominal or pelvic metastatic disease.  Large stool burden noted; recommend correlation for symptoms or signs of constipation.  CT A/P 04/28/23 IMPRESSION: Rounded polypoid lesion in the proximal descending colon, stable in size.  She does report she had normal colonoscopy back in 2016 at an outside facility. This all presumably has occurred since. The colonoscopy 2016 she reports is being her first. She denies any symptoms related to this mass that was identified at her more recent colonoscopies. Denies abdominal pain. She has had some reflux issues which have been since her bypass procedures and revisions.  She denies any blood in her stool or unintentional weight loss. She is currently on Mounjaro and has had a significant weight loss on that. Between her bypasses and medical management, she is lost approximately 150 pounds.  She denies any changes in health or health history since we met in the office. No new medications/allergies. She states she is ready for surgery today.  PMH: Hypothyroidism, GERD, HLD  PSH: Laparoscopic cholecystectomy 2003 - Dr. Samuella Cota  mini gastric bypass (2008) complicated by intraoperative bleeding requiring left subcostal incision; revision to formal gastric bypass bypass revision (2009); revision of gastric bypass (2011); laparoscopic incisional hernia repair (subcostal with mesh) - [place]  FHx: Maternal grandfather had colon cancer; otherwise, denies any known family history of colorectal, breast, endometrial or ovarian cancer  Social Hx: Denies use of tobacco/EtOH/illicit drug. She works as a Librarian, academic; she also  works as a Radiographer, therapeutic. She is here today by herself.   Past Medical History:  Diagnosis Date   Allergy    Anemia    Anxiety    Arthritis    Cataract    Depression    Fibrocystic breast    Fibromyalgia    11/26/16 'resolved'   GERD (gastroesophageal reflux disease)    Hyperlipidemia    Hypothyroidism    Migraines    Obesity    Osteoporosis    Sleep apnea    not currently   Thyroid disease     Past Surgical History:  Procedure Laterality Date   BIOPSY  04/15/2023   Procedure: BIOPSY;  Surgeon: Lemar Lofty., MD;  Location: [facility];  Service: Gastroenterology;;   COLONOSCOPY WITH PROPOFOL N/A 10/15/2022   Procedure: COLONOSCOPY WITH PROPOFOL;  Surgeon: Lemar Lofty., MD;  Location: [facility];  Service: Gastroenterology;  Laterality: N/A;   COLONOSCOPY WITH PROPOFOL N/A 04/15/2023   Procedure: COLONOSCOPY WITH PROPOFOL;  Surgeon: Meridee Score Netty Starring., MD;  Location: [facility];  Service: Gastroenterology;  Laterality: N/A;   ENDOSCOPIC MUCOSAL RESECTION N/A 10/15/2022   Procedure: ENDOSCOPIC MUCOSAL RESECTION;  Surgeon: Meridee Score Netty Starring., MD;  Location: [facility];  Service: Gastroenterology;  Laterality: N/A;   ESOPHAGOGASTRODUODENOSCOPY (EGD) WITH PROPOFOL N/A 04/15/2023   Procedure: ESOPHAGOGASTRODUODENOSCOPY (EGD) WITH PROPOFOL;  Surgeon: Meridee Score Netty Starring., MD;  Location: [facility];  Service: Gastroenterology;  Laterality: N/A;   EUS N/A 10/15/2022   Procedure: LOWER ENDOSCOPIC ULTRASOUND (EUS);  Surgeon: Lemar Lofty., MD;  Location: [facility];  Service: Gastroenterology;  Laterality: N/A;   GASTRIC BYPASS  04/2007, revision 2009 and 2011   "Mini" gastric bypass   HEMOSTASIS CLIP PLACEMENT  10/15/2022   Procedure: HEMOSTASIS CLIP PLACEMENT;  Surgeon: Lemar Lofty., MD;  Location: [facility];  Service: Gastroenterology;;   HEMOSTASIS CONTROL  10/15/2022   Procedure: HEMOSTASIS CONTROL;  Surgeon: Lemar Lofty., MD;  Location: [facility];  Service: Gastroenterology;;   HERNIA REPAIR  2009   incisional repair from gastsric bypass   LAPAROSCOPIC CHOLECYSTECTOMY  09/2002   Dr. Samuella Cota   POLYPECTOMY  10/15/2022   Procedure: POLYPECTOMY;  Surgeon: Lemar Lofty., MD;  Location: [facility];  Service: Gastroenterology;;   POLYPECTOMY  04/15/2023   Procedure: POLYPECTOMY;  Surgeon: Lemar Lofty., MD;  Location: [facility];  Service: Gastroenterology;;   TONSILLECTOMY AND ADENOIDECTOMY  age  49   WISDOM TOOTH EXTRACTION  age 35    Family History  Problem Relation Age of Onset   Hyperlipidemia Mother    Prostate cancer Father 46   Thyroid disease Father    Melanoma Brother 35       back of knee    Stroke Maternal Grandmother    Colon cancer Maternal Grandfather 95       colon caner   Pancreatic cancer Paternal Grandmother            Breast cancer Neg Hx    Esophageal cancer Neg Hx    Stomach cancer Neg Hx    Rectal cancer Neg Hx    Inflammatory bowel disease Neg Hx    Liver disease Neg Hx     Social:  reports that she has never smoked. She has never used smokeless tobacco. She reports that she does not drink alcohol and does not use drugs.  Allergies:  Allergies  Allergen Reactions   Albertsons Diphedryl [Diphenhydramine] Other (See Comments)    Heart palpitations  Heart palpitations    Atorvastatin Other (See Comments)    Lipitor= Muscle aches   Adhesive [Tape] Rash    Medications: I have reviewed the patient's current medications.  No results found for this or any previous visit (from the past 48 hour(s)).  No results found.   PE Last menstrual period 09/14/2010. Constitutional: NAD; conversant Eyes: Moist conjunctiva; no lid lag; anicteric Lungs: Normal respiratory effort CV: RRR GI: Abd soft, NT/ND Psychiatric: Appropriate affect  No results found for this or any previous visit (from the past 48 hour(s)).  No results  found.  A/P: VENIDA DUNKLEY is an 57 y.o. female with hx of hypothyroidism, GERD, HLD here for evaluation of plexiform fibromyxoma  -The anatomy and physiology of the GI tract was reviewed with the patient. The pathophysiology of colon polyps, cancers, and unusual tumors such as the plexiform fibromyxoma was discussed as well with associated pictures. -We have discussed various different treatment options going forward including further observation with surveillance colonoscopies versus surgery-robotic assisted partial colectomy, possible lysis of adhesions, possible flexible sigmoidoscopy -The procedures, material risks (including, but not limited to, pain, bleeding, infection, scarring, need for blood transfusion, damage to surrounding structures- blood vessels/nerves/viscus/organs, damage to ureter, urine leak, leak from anastomosis, need for additional procedures, scenarios where a stoma may be necessary and where it may be permanent, worsening of pre-existing medical conditions, chronic diarrhea, constipation secondary to narcotic use, hernia, recurrence, pneumonia, heart attack, stroke, death) benefits and alternatives to surgery were discussed at length. The patient's questions were answered to her satisfaction, she voiced understanding and after considering all options including possibility of observation, has elected to proceed with surgery. Additionally, we discussed typical postoperative expectations and the recovery process.  -Her  thoughts behind proceeding with surgery are primarily that of peace of mind reasons given the relatively rare nature of her tumor/mass, possibility of long-term recurrence/growth and even the ultimate potential for something like malignant degeneration of the mass. All of this makes sense to me as well.   Marin Olp, MD [facility], [facility]

## 2023-08-20 ENCOUNTER — Other Ambulatory Visit (HOSPITAL_COMMUNITY): Payer: Self-pay

## 2023-08-20 ENCOUNTER — Other Ambulatory Visit: Payer: Self-pay

## 2023-08-20 ENCOUNTER — Encounter (HOSPITAL_COMMUNITY): Payer: Self-pay | Admitting: Surgery

## 2023-08-20 LAB — CBC
HCT: 35.4 % — ABNORMAL LOW (ref 36.0–46.0)
Hemoglobin: 11.7 g/dL — ABNORMAL LOW (ref 12.0–15.0)
MCH: 32.1 pg (ref 26.0–34.0)
MCHC: 33.1 g/dL (ref 30.0–36.0)
MCV: 97 fL (ref 80.0–100.0)
Platelets: 183 10*3/uL (ref 150–400)
RBC: 3.65 MIL/uL — ABNORMAL LOW (ref 3.87–5.11)
RDW: 12.6 % (ref 11.5–15.5)
WBC: 12.6 10*3/uL — ABNORMAL HIGH (ref 4.0–10.5)
nRBC: 0 % (ref 0.0–0.2)

## 2023-08-20 LAB — BASIC METABOLIC PANEL
Anion gap: 9 (ref 5–15)
BUN: 7 mg/dL (ref 6–20)
CO2: 23 mmol/L (ref 22–32)
Calcium: 7.7 mg/dL — ABNORMAL LOW (ref 8.9–10.3)
Chloride: 101 mmol/L (ref 98–111)
Creatinine, Ser: 0.73 mg/dL (ref 0.44–1.00)
GFR, Estimated: >60 mL/min (ref >60)
Glucose, Bld: 143 mg/dL — ABNORMAL HIGH (ref 70–99)
Potassium: 3.3 mmol/L — ABNORMAL LOW (ref 3.5–5.1)
Sodium: 133 mmol/L — ABNORMAL LOW (ref 135–145)

## 2023-08-20 MED ORDER — OXYCODONE HCL 5 MG PO TABS
5.0000 mg | ORAL_TABLET | Freq: Four times a day (QID) | ORAL | 0 refills | Status: AC | PRN
  Filled 2023-08-20: qty 10, 3d supply, fill #0

## 2023-08-20 NOTE — Progress Notes (Signed)
  Subjective No acute events. Feeling quite well. No n/v. Tolerating liquids without difficulty. Ambulating. Pain controlled. No flatus/bm yet.  Objective: Vital signs in last 24 hours: Temp:  [97.5 F (36.4 C)-98.5 F (36.9 C)] 97.5 F (36.4 C) (12/06 1229) Pulse Rate:  [66-91] 83 (12/06 1229) Resp:  [14-23] 18 (12/06 1229) BP: (104-123)/(68-90) 111/72 (12/06 1229) SpO2:  [92 %-100 %] 98 % (12/06 1229) Weight:  [69 kg] 69 kg (12/06 1000) Last BM Date : 08/19/23  Intake/Output from previous day: 12/05 0701 - 12/06 0700 In: 2678.3 [P.O.:1200; I.V.:1228.3; IV Piggyback:250] Out: 1300 [Urine:1250; Blood:50] Intake/Output this shift: Total I/O In: -  Out: 100 [Urine:100]  Gen: NAD, comfortable; up in chair drinking liquids CV: RRR Pulm: Normal work of breathing Abd: Soft, appropriate mild incisional soreness, nondistended; incisions c/d/I without erythema nor drainage. Ext: SCDs in place  Lab Results: CBC  Recent Labs    08/20/23 0457  WBC 12.6*  HGB 11.7*  HCT 35.4*  PLT 183   BMET Recent Labs    08/20/23 0457  NA 133*  K 3.3*  CL 101  CO2 23  GLUCOSE 143*  BUN 7  CREATININE 0.73  CALCIUM 7.7*   PT/INR No results for input(s): "LABPROT", "INR" in the last 72 hours. ABG No results for input(s): "PHART", "HCO3" in the last 72 hours.  Invalid input(s): "PCO2", "PO2"  Studies/Results:  Anti-infectives: Anti-infectives (From admission, onward)    Start     Dose/Rate Route Frequency Ordered Stop   08/19/23 1400  neomycin (MYCIFRADIN) tablet 1,000 mg  Status:  Discontinued       Placed in "And" Linked Group   1,000 mg Oral 3 times per day 08/19/23 1230 08/19/23 1235   08/19/23 1400  metroNIDAZOLE (FLAGYL) tablet 1,000 mg  Status:  Discontinued       Placed in "And" Linked Group   1,000 mg Oral 3 times per day 08/19/23 1230 08/19/23 1235   08/19/23 1245  cefoTEtan (CEFOTAN) 2 g in sodium chloride 0.9 % 100 mL IVPB        2 g 200 mL/hr over 30 Minutes  Intravenous On call to O.R. 08/19/23 1230 08/19/23 1430        Assessment/Plan: Patient Active Problem List   Diagnosis Date Noted   Colonic mass 08/19/2023   History of cholecystectomy 09/25/2022   Chronic diarrhea 09/25/2022   Abnormal colonoscopy 09/23/2022   History of colonic polyps 09/23/2022   Lesion of colon 09/23/2022   Intermittent fever of unknown origin 02/15/2020   On selective serotonin reuptake inhibitor (SSRI) therapy 11/26/2016   Chronically on benzodiazepine therapy 11/26/2016   Snoring 11/26/2016   Sleep related headaches 11/26/2016   Hair loss disorder 04/14/2016   Vitamin D deficiency 04/14/2016   History of gastric bypass 04/14/2016   Migraine without aura 05/06/2012   s/p Procedure(s): ROBOTIC ASSISTED PARTIAL COLECTOMY; TAKEDOWN OF SPLENIC FLEXURE; TAP BLOCK LYSIS OF ADHESION 08/19/2023  - Doing quite well - DC IV fluid, DC Foley - Ambulate 5 times per day - Continue Entereg today; awaiting return of bowel function - PPX: SQH, SCDs  We spent time reviewing her procedure, findings, and plans moving forward.  We also spent time discussing recovery expectations as well as postoperative expectations.  All of her questions were answered, she expressed understanding and agreement with the plan.   LOS: 1 day   Marin Olp, MD [facility], [facility]

## 2023-08-20 NOTE — Discharge Instructions (Signed)
POST OP INSTRUCTIONS AFTER COLON SURGERY  DIET: Be sure to include lots of fluids daily to stay hydrated - 64oz of water per day (8, 8 oz glasses).  Avoid fast food or heavy meals for the first couple of weeks as your are more likely to get nauseated. Avoid raw/uncooked fruits or vegetables for the first 4 weeks (its ok to have these if they are blended into smoothie form). If you have fruits/vegetables, make sure they are cooked until soft enough to mash on the roof of your mouth and chew your food well. Otherwise, diet as tolerated.  Take your usually prescribed home medications unless otherwise directed.  PAIN CONTROL: Pain is best controlled by a usual combination of three different methods TOGETHER: Ice/Heat Over the counter pain medication Prescription pain medication Most patients will experience some swelling and bruising around the surgical site.  Ice packs or heating pads (30-60 minutes up to 6 times a day) will help. Some people prefer to use ice alone, heat alone, alternating between ice & heat.  Experiment to what works for you.  Swelling and bruising can take several weeks to resolve.   It is helpful to take an over-the-counter pain medication regularly for the first few weeks: Ibuprofen (Motrin/Advil) - 200mg tabs - take 3 tabs (600mg) every 6 hours as needed for pain (unless you have been directed previously to avoid NSAIDs/ibuprofen) Acetaminophen (Tylenol) - you may take 650mg every 6 hours as needed. You can take this with motrin as they act differently on the body. If you are taking a narcotic pain medication that has acetaminophen in it, do not take over the counter tylenol at the same time. NOTE: You may take both of these medications together - most patients  find it most helpful when alternating between the two (i.e. Ibuprofen at 6am, tylenol at 9am, ibuprofen at 12pm ...) A  prescription for pain medication should be given to you upon discharge.  Take your pain medication as  prescribed if your pain is not adequatly controlled with the over-the-counter pain reliefs mentioned above.  Avoid getting constipated.  Between the surgery and the pain medications, it is common to experience some constipation.  Increasing fluid intake and taking a fiber supplement (such as Metamucil, Citrucel, FiberCon, MiraLax, etc) 1-2 times a day regularly will usually help prevent this problem from occurring.  A mild laxative (prune juice, Milk of Magnesia, MiraLax, etc) should be taken according to package directions if there are no bowel movements after 48 hours.    Dressing: Your incisions are covered in Dermabond which is like sterile superglue for the skin. This will come off on it's own in a couple weeks. It is waterproof and you may bathe normally starting the day after your surgery in a shower. Avoid baths/pools/lakes/oceans until your wounds have fully healed.  ACTIVITIES as tolerated:   Avoid heavy lifting (>10lbs or 1 gallon of milk) for the next 6 weeks. You may resume regular daily activities as tolerated--such as daily self-care, walking, climbing stairs--gradually increasing activities as tolerated.  If you can walk 30 minutes without difficulty, it is safe to try more intense activity such as jogging, treadmill, bicycling, low-impact aerobics.  DO NOT PUSH THROUGH PAIN.  Let pain be your guide: If it hurts to do something, don't do it. You may drive when you are no longer taking prescription pain medication, you can comfortably wear a seatbelt, and you can safely maneuver your car and apply brakes.  FOLLOW UP in our   office Please call [facility] at [phone] to set up an appointment to see your surgeon in the office for a follow-up appointment approximately 2 weeks after your surgery. Make sure that you call for this appointment the day you arrive home to insure a convenient appointment time.  9. If you have disability or family leave forms that need to be completed, you may have  them completed by your primary care physician's office; for return to work instructions, please ask our office staff and they will be happy to assist you in obtaining this documentation   When to call us [phone]: Poor pain control Reactions / problems with new medications (rash/itching, etc)  Fever over 101.5 F (38.5 C) Inability to urinate Nausea/vomiting Worsening swelling or bruising Continued bleeding from incision. Increased pain, redness, or drainage from the incision  The clinic staff is available to answer your questions during regular business hours (8:30am-5pm).  Please don't hesitate to call and ask to speak to one of our nurses for clinical concerns.   A surgeon from [facility] is always on call at the hospitals   If you have a medical emergency, go to the nearest emergency room or call 911.  [facility] [address], [address], [place], [place]  [address] MAIN: [phone] FAX: [phone] [email or web address]  

## 2023-08-20 NOTE — TOC Initial Note (Signed)
Transition of Care [facility]) - Initial/Assessment Note    Patient Details  Name: Katherine Todd MRN: [id number] Date of Birth: 04-Jul-1966  [facility] CM/SW Contact:    Adrian Prows, RN Phone Number: 08/20/2023, 3:47 PM  Clinical Narrative:                 Sherron Monday w/ pt in room; pt says she lives at home; she plans to return at d/c; pt verified she has PCP/insurance; she identified POC mother Aela Dryden [phone]); pt says she has transportation; she denies SDOH risks; pt says she does not have DME, HH services, or home oxygen; no TOC needs; TOC signing off; please place consult if needed.  Expected Discharge Plan: Home/Self Care Barriers to Discharge: Continued Medical Work up   Patient Goals and CMS Choice Patient states their goals for this hospitalization and ongoing recovery are:: home          Expected Discharge Plan and Services   Discharge Planning Services: CM Consult   Living arrangements for the past 2 months: Single Family Home                                      Prior Living Arrangements/Services Living arrangements for the past 2 months: Single Family Home Lives with:: Self Patient language and need for interpreter reviewed:: Yes Do you feel safe going back to the place where you live?: Yes      Need for Family Participation in Patient Care: Yes (Comment) Care giver support system in place?: Yes (comment) Current home services:  (n/a) Criminal Activity/Legal Involvement Pertinent to Current Situation/Hospitalization: No - Comment as needed  Activities of Daily Living   ADL Screening (condition at time of admission) Independently performs ADLs?: Yes (appropriate for developmental age) Is the patient deaf or have difficulty hearing?: No Does the patient have difficulty seeing, even when wearing glasses/contacts?: No Does the patient have difficulty concentrating, remembering, or making decisions?: No  Permission  Sought/Granted Permission sought to share information with : Case Manager Permission granted to share information with : Yes, Verbal Permission Granted  Share Information with NAME: Case Manager     Permission granted to share info w Relationship: Salsabeel Kinkade (mother) [phone]     Emotional Assessment Appearance:: Appears stated age Attitude/Demeanor/Rapport: Gracious Affect (typically observed): Accepting Orientation: : Oriented to Self, Oriented to  Time, Oriented to Place, Oriented to Situation Alcohol / Substance Use: Not Applicable Psych Involvement: No (comment)  Admission diagnosis:  Colonic mass [K63.89] Patient Active Problem List   Diagnosis Date Noted   Colonic mass 08/19/2023   History of cholecystectomy 09/25/2022   Chronic diarrhea 09/25/2022   Abnormal colonoscopy 09/23/2022   History of colonic polyps 09/23/2022   Lesion of colon 09/23/2022   Intermittent fever of unknown origin 02/15/2020   On selective serotonin reuptake inhibitor (SSRI) therapy 11/26/2016   Chronically on benzodiazepine therapy 11/26/2016   Snoring 11/26/2016   Sleep related headaches 11/26/2016   Hair loss disorder 04/14/2016   Vitamin D deficiency 04/14/2016   History of gastric bypass 04/14/2016   Migraine without aura 05/06/2012   PCP:  Garlan Fillers, MD Pharmacy:   [id number] - [place], [place] - [address] AT [facility] OF [address] & [facility] [address] [place] [address] Phone: [phone] Fax: [phone]  [facility]  [address] [place] [address] Phone: [phone] Fax: [phone]     Social Determinants of Health (SDOH) Social History: SDOH Screenings   Food Insecurity: No Food Insecurity (08/20/2023)  Housing: Low Risk  (08/20/2023)  Transportation Needs: No Transportation Needs (08/20/2023)  Utilities: Not At Risk (08/20/2023)  Depression (PHQ2-9): Low Risk  (04/27/2023)  Tobacco Use: Low  Risk  (08/19/2023)   SDOH Interventions: Food Insecurity Interventions: Intervention Not Indicated, Inpatient TOC Housing Interventions: Intervention Not Indicated, Inpatient TOC Transportation Interventions: Intervention Not Indicated, Inpatient TOC Utilities Interventions: Intervention Not Indicated, Inpatient TOC   Readmission Risk Interventions     No data to display

## 2023-08-20 NOTE — Progress Notes (Signed)
Mobility Specialist - Progress Note   08/20/23 0903  Mobility  Activity Ambulated independently in hallway  Level of Assistance Independent  Assistive Device None  Distance Ambulated (ft) 500 ft  Activity Response Tolerated well  Mobility Referral Yes  Mobility visit 1 Mobility  Mobility Specialist Start Time (ACUTE ONLY) 0854  Mobility Specialist Stop Time (ACUTE ONLY) 0902  Mobility Specialist Time Calculation (min) (ACUTE ONLY) 8 min   Pt received in recliner and agreeable to mobility. No complaints during session. Pt to recliner after session with all needs met.    [facility]

## 2023-08-20 NOTE — Progress Notes (Signed)
Discharge medication  (oxycodone) picked up in a secure bag from [facility] by this RN - pt will go home Saturday or Sunday. Secure bag delivered to  inpatient pharmacy since pt does not have anyone coming in tonight who can take them home for her. Primary nurse updated by this RN

## 2023-08-21 LAB — BASIC METABOLIC PANEL
Anion gap: 7 (ref 5–15)
BUN: 11 mg/dL (ref 6–20)
CO2: 26 mmol/L (ref 22–32)
Calcium: 7.9 mg/dL — ABNORMAL LOW (ref 8.9–10.3)
Chloride: 93 mmol/L — ABNORMAL LOW (ref 98–111)
Creatinine, Ser: 0.79 mg/dL (ref 0.44–1.00)
GFR, Estimated: >60 mL/min (ref >60)
Glucose, Bld: 104 mg/dL — ABNORMAL HIGH (ref 70–99)
Potassium: 3.8 mmol/L (ref 3.5–5.1)
Sodium: 126 mmol/L — ABNORMAL LOW (ref 135–145)

## 2023-08-21 LAB — CBC
HCT: 36.5 % (ref 36.0–46.0)
Hemoglobin: 11.7 g/dL — ABNORMAL LOW (ref 12.0–15.0)
MCH: 31.5 pg (ref 26.0–34.0)
MCHC: 32.1 g/dL (ref 30.0–36.0)
MCV: 98.4 fL (ref 80.0–100.0)
Platelets: 193 10*3/uL (ref 150–400)
RBC: 3.71 MIL/uL — ABNORMAL LOW (ref 3.87–5.11)
RDW: 12.5 % (ref 11.5–15.5)
WBC: 6.8 10*3/uL (ref 4.0–10.5)
nRBC: 0 % (ref 0.0–0.2)

## 2023-08-21 MED ORDER — TRAMADOL HCL 50 MG PO TABS: 50.0000 mg | ORAL_TABLET | Freq: Four times a day (QID) | ORAL | 0 refills | Status: AC | PRN

## 2023-08-21 NOTE — Plan of Care (Signed)
  Problem: Education: Goal: Understanding of discharge needs will improve Outcome: Adequate for Discharge Goal: Verbalization of understanding of the causes of altered bowel function will improve Outcome: Adequate for Discharge   Problem: Activity: Goal: Ability to tolerate increased activity will improve Outcome: Adequate for Discharge   Problem: Bowel/Gastric: Goal: Gastrointestinal status for postoperative course will improve Outcome: Adequate for Discharge   Problem: Health Behavior/Discharge Planning: Goal: Identification of community resources to assist with postoperative recovery needs will improve Outcome: Adequate for Discharge   Problem: Nutritional: Goal: Will attain and maintain optimal nutritional status will improve Outcome: Adequate for Discharge   Problem: Clinical Measurements: Goal: Postoperative complications will be avoided or minimized Outcome: Adequate for Discharge   Problem: Respiratory: Goal: Respiratory status will improve Outcome: Adequate for Discharge   Problem: Skin Integrity: Goal: Will show signs of wound healing Outcome: Adequate for Discharge   Problem: Education: Goal: Knowledge of General Education information will improve Description: Including pain rating scale, medication(s)/side effects and non-pharmacologic comfort measures Outcome: Adequate for Discharge   Problem: Health Behavior/Discharge Planning: Goal: Ability to manage health-related needs will improve Outcome: Adequate for Discharge   Problem: Clinical Measurements: Goal: Ability to maintain clinical measurements within normal limits will improve Outcome: Adequate for Discharge Goal: Will remain free from infection Outcome: Adequate for Discharge Goal: Diagnostic test results will improve Outcome: Adequate for Discharge Goal: Respiratory complications will improve Outcome: Adequate for Discharge Goal: Cardiovascular complication will be avoided Outcome: Adequate for  Discharge   Problem: Activity: Goal: Risk for activity intolerance will decrease Outcome: Adequate for Discharge   Problem: Nutrition: Goal: Adequate nutrition will be maintained Outcome: Adequate for Discharge   Problem: Coping: Goal: Level of anxiety will decrease Outcome: Adequate for Discharge   Problem: Elimination: Goal: Will not experience complications related to bowel motility Outcome: Adequate for Discharge Goal: Will not experience complications related to urinary retention Outcome: Adequate for Discharge   Problem: Pain Management: Goal: General experience of comfort will improve Outcome: Adequate for Discharge   Problem: Safety: Goal: Ability to remain free from injury will improve Outcome: Adequate for Discharge   Problem: Skin Integrity: Goal: Risk for impaired skin integrity will decrease Outcome: Adequate for Discharge

## 2023-08-21 NOTE — Progress Notes (Signed)
Reviewed written d/c instructions w pt and all questions answered. She verbalized understanding. D/C via w/c w all belongings in stable condition. 

## 2023-08-22 NOTE — Discharge Summary (Signed)
Physician Discharge Summary  Patient ID: Katherine Todd MRN: [id number] DOB/AGE: 03/17/1966 57 y.o.  Admit date: 08/19/2023 Discharge date: 08/22/2023  Admission Diagnoses: Colon mass  Discharge Diagnoses:  Principal Problem:   Colonic mass Status post colectomy  Discharged Condition: good  Hospital Course: Patient was admitted postop after colon resection.  She did well postop and was started on a rash protocol.  She was tolerating p.o. well.  She had good pain control.  She is otherwise ambulating well on her own.  She was deemed stable for discharge and discharged home.  Consults: None  Significant Diagnostic Studies: None  Treatments: surgery: As above  Discharge Exam: Blood pressure 125/77, pulse 72, temperature 98.1 F (36.7 C), temperature source Oral, resp. rate 18, height 5\' 4"  (1.626 m), weight 69 kg, last menstrual period 09/14/2010, SpO2 99%. General appearance: alert and cooperative GI: soft, non-tender; bowel sounds normal; no masses,  no organomegaly and incisions clean dry and intact.  Disposition: Discharge disposition: 01-Home or Self Care        Allergies as of 08/21/2023       Reactions   [employer] Diphedryl [diphenhydramine] Other (See Comments)   Heart palpitations  Heart palpitations    Atorvastatin Other (See Comments)   Lipitor= Muscle aches   Adhesive [tape] Rash        Medication List     TAKE these medications    acetaminophen 500 MG tablet Commonly known as: TYLENOL Take 1,000 mg by mouth 2 (two) times daily.   ARIPiprazole 5 MG tablet Commonly known as: ABILIFY Take 2.5 mg by mouth in the morning.   bisacodyl 5 MG EC tablet Commonly known as: DULCOLAX Take 5 mg by mouth once.   buPROPion 300 MG 24 hr tablet Commonly known as: WELLBUTRIN XL Take 300 mg by mouth in the morning.   calcium carbonate 500 MG chewable tablet Commonly known as: TUMS - dosed in mg elemental calcium Chew 1 tablet by mouth in the morning.    cetirizine 10 MG tablet Commonly known as: ZYRTEC Take 10 mg by mouth at bedtime.   clonazePAM 0.5 MG tablet Commonly known as: KLONOPIN Take 0.5 mg by mouth 2 (two) times daily as needed for anxiety.   cyanocobalamin 250 MCG tablet Commonly known as: VITAMIN B12 Take 250 mcg by mouth every evening.   denosumab 60 MG/ML Sosy injection Commonly known as: PROLIA Inject 60 mg into the skin every 6 (six) months.   famotidine 20 MG tablet Commonly known as: PEPCID Take 20 mg by mouth 2 (two) times daily.   levothyroxine 50 MCG tablet Commonly known as: SYNTHROID Take 50 mcg by mouth daily before breakfast.   lovastatin 20 MG tablet Commonly known as: MEVACOR Take 20 mg by mouth once a week.   Mounjaro 10 MG/0.5ML Pen Generic drug: tirzepatide Inject 10 mg into the skin once a week.   multivitamin with minerals Tabs tablet Take 1 tablet by mouth in the morning and at bedtime.   neomycin 500 MG tablet Commonly known as: MYCIFRADIN Take 1,000 mg by mouth 3 (three) times daily.   OCUVITE EXTRA PO Take 1 tablet by mouth at bedtime.   omeprazole 40 MG capsule Commonly known as: PRILOSEC Take 1 capsule (40 mg total) by mouth 2 (two) times daily before a meal. Crack capsules open prior to ingestion   ondansetron 4 MG disintegrating tablet Commonly known as: ZOFRAN-ODT Take 1 tablet (4 mg total) by mouth every 6 (six) hours as needed for  nausea or vomiting.   ondansetron 4 MG tablet Commonly known as: ZOFRAN Take 4 mg by mouth daily.   OVER THE COUNTER MEDICATION Apply 1 application  topically 3 (three) times a week. Hairstim   oxyCODONE 5 MG immediate release tablet Commonly known as: Roxicodone Take 1 tablet (5 mg total) by mouth every 6 (six) hours as needed for up to 5 days (postop pain not controlled with tylenol and ibuprofen first).   polycarbophil 625 MG tablet Commonly known as: FIBERCON Take 625 mg by mouth 2 (two) times daily.   propranolol 10 MG  tablet Commonly known as: INDERAL Take 1 tablet by mouth daily as needed (difficulty driving over bridges).   sucralfate 1 g tablet Commonly known as: Carafate Take 1 tablet (1 g total) by mouth every 6 (six) hours. You can dissolve 1 tablet in a TBSP of distilled water and drink as a slurry   SUMAtriptan 100 MG tablet Commonly known as: IMITREX TAKE 1 TABLET BY MOUTH ONCE AS NEEDED FOR MIGRAINE(MAY REPEAT IN 2 HORUS. MAX OF 2 TABLETS PER 24 HOURS)   traMADol 50 MG tablet Commonly known as: ULTRAM Take 1 tablet (50 mg total) by mouth every 6 (six) hours as needed (postop pain not controlled with ibuprofen first).   triamterene-hydrochlorothiazide 37.5-25 MG tablet Commonly known as: MAXZIDE-25 Take 1 tablet by mouth every morning.   Trintellix 20 MG Tabs tablet Generic drug: vortioxetine HBr Take 20 mg by mouth in the morning.   Vitamin D (Ergocalciferol) 1.25 MG (50000 UNIT) Caps capsule Commonly known as: DRISDOL Take 50,000 Units by mouth See admin instructions. Take 1 capsule twice daily on Mondays through Fridays.   VITAMIN D3 PO Place 5,000 Units into both eyes in the morning.   vitamin E 180 MG (400 UNITS) capsule Take 400 Units by mouth at bedtime.        Follow-up Information     Andria Meuse, MD Follow up on 09/20/2023.   Specialties: General Surgery, Colon and Rectal Surgery Why: Please arrive by 8:50 am Contact information: [address] [place] [address] [place] [address] [phone]                 Signed: Axel Filler 08/22/2023, 8:42 AM

## 2023-08-23 ENCOUNTER — Other Ambulatory Visit: Payer: Self-pay

## 2023-08-23 ENCOUNTER — Encounter (HOSPITAL_BASED_OUTPATIENT_CLINIC_OR_DEPARTMENT_OTHER): Payer: Self-pay | Admitting: Obstetrics & Gynecology

## 2023-08-23 NOTE — Anesthesia Postprocedure Evaluation (Signed)
Anesthesia Post Note  Patient: Katherine Todd  Procedure(s) Performed: ROBOTIC ASSISTED PARTIAL COLECTOMY; TAKEDOWN OF SPLENIC FLEXURE; TAP BLOCK LYSIS OF ADHESION     Patient location during evaluation: PACU Anesthesia Type: General Level of consciousness: awake and alert Pain management: pain level controlled Vital Signs Assessment: post-procedure vital signs reviewed and stable Respiratory status: spontaneous breathing, nonlabored ventilation, respiratory function stable and patient connected to nasal cannula oxygen Cardiovascular status: blood pressure returned to baseline and stable Postop Assessment: no apparent nausea or vomiting Anesthetic complications: no   No notable events documented.    Shelton Silvas

## 2023-08-24 LAB — SURGICAL PATHOLOGY

## 2023-08-25 ENCOUNTER — Other Ambulatory Visit (HOSPITAL_COMMUNITY): Payer: Self-pay

## 2023-09-01 ENCOUNTER — Other Ambulatory Visit: Payer: Self-pay

## 2023-09-01 MED ORDER — SUCRALFATE 1 G PO TABS: 1.0000 g | ORAL_TABLET | Freq: Four times a day (QID) | ORAL | 3 refills | Status: DC

## 2023-09-13 ENCOUNTER — Other Ambulatory Visit (HOSPITAL_COMMUNITY): Payer: Self-pay

## 2023-09-13 MED ORDER — MOUNJARO 10 MG/0.5ML ~~LOC~~ SOAJ
10.0000 mg | SUBCUTANEOUS | 3 refills | Status: DC
  Filled 2023-09-13 – 2024-05-26 (×2): qty 2, 28d supply, fill #0
  Filled 2024-06-18: qty 2, 28d supply, fill #1
  Filled 2024-07-17: qty 2, 28d supply, fill #2
  Filled 2024-08-13: qty 2, 28d supply, fill #3

## 2023-09-16 ENCOUNTER — Encounter (HOSPITAL_COMMUNITY): Payer: Self-pay

## 2023-09-16 ENCOUNTER — Other Ambulatory Visit (HOSPITAL_COMMUNITY): Payer: Self-pay

## 2023-09-16 ENCOUNTER — Other Ambulatory Visit (HOSPITAL_BASED_OUTPATIENT_CLINIC_OR_DEPARTMENT_OTHER): Payer: Self-pay

## 2023-09-16 MED ORDER — MOUNJARO 7.5 MG/0.5ML ~~LOC~~ SOAJ
7.5000 mg | SUBCUTANEOUS | 3 refills | Status: DC
  Filled 2023-09-16: qty 2, 28d supply, fill #0
  Filled 2023-10-11: qty 2, 28d supply, fill #1
  Filled 2023-11-07: qty 2, 28d supply, fill #2
  Filled 2023-12-05: qty 2, 28d supply, fill #3

## 2023-09-18 ENCOUNTER — Other Ambulatory Visit (HOSPITAL_COMMUNITY): Payer: Self-pay

## 2023-09-20 ENCOUNTER — Other Ambulatory Visit (HOSPITAL_COMMUNITY): Payer: Self-pay

## 2023-09-21 ENCOUNTER — Other Ambulatory Visit (HOSPITAL_COMMUNITY): Payer: Self-pay

## 2023-09-23 ENCOUNTER — Other Ambulatory Visit (HOSPITAL_COMMUNITY): Payer: Self-pay

## 2023-10-11 ENCOUNTER — Other Ambulatory Visit (HOSPITAL_COMMUNITY): Payer: Self-pay

## 2023-10-16 ENCOUNTER — Other Ambulatory Visit (HOSPITAL_COMMUNITY): Payer: Self-pay

## 2023-11-08 ENCOUNTER — Other Ambulatory Visit (HOSPITAL_COMMUNITY): Payer: Self-pay

## 2023-11-10 ENCOUNTER — Encounter: Payer: Self-pay | Admitting: Gastroenterology

## 2023-12-06 ENCOUNTER — Other Ambulatory Visit (HOSPITAL_COMMUNITY): Payer: Self-pay

## 2023-12-07 ENCOUNTER — Other Ambulatory Visit (HOSPITAL_COMMUNITY): Payer: Self-pay

## 2023-12-08 ENCOUNTER — Encounter (HOSPITAL_COMMUNITY)
Admission: RE | Admit: 2023-12-08 | Discharge: 2023-12-08 | Disposition: A | Discharge status: Home / Self Care | Source: Ambulatory Visit | Attending: Internal Medicine | Admitting: Internal Medicine

## 2023-12-08 DIAGNOSIS — M810 Age-related osteoporosis without current pathological fracture: Secondary | ICD-10-CM | POA: Insufficient documentation

## 2023-12-08 MED ORDER — DENOSUMAB 60 MG/ML ~~LOC~~ SOSY
PREFILLED_SYRINGE | SUBCUTANEOUS | Status: AC
  Filled 2023-12-08: qty 1

## 2023-12-08 MED ORDER — DENOSUMAB 60 MG/ML ~~LOC~~ SOSY
60.0000 mg | PREFILLED_SYRINGE | Freq: Once | SUBCUTANEOUS | Status: AC
  Administered 2023-12-08: 60 mg via SUBCUTANEOUS

## 2024-01-03 ENCOUNTER — Other Ambulatory Visit (HOSPITAL_BASED_OUTPATIENT_CLINIC_OR_DEPARTMENT_OTHER): Payer: Self-pay

## 2024-01-03 ENCOUNTER — Other Ambulatory Visit (HOSPITAL_COMMUNITY): Payer: Self-pay

## 2024-01-03 MED ORDER — MOUNJARO 7.5 MG/0.5ML ~~LOC~~ SOAJ
7.5000 mg | SUBCUTANEOUS | 3 refills | Status: DC
  Filled 2024-01-03: qty 2, 28d supply, fill #0
  Filled 2024-01-23 – 2024-01-26 (×3): qty 2, 28d supply, fill #1
  Filled 2024-02-24: qty 2, 28d supply, fill #2
  Filled 2024-04-01 (×2): qty 2, 28d supply, fill #3

## 2024-01-05 ENCOUNTER — Encounter (HOSPITAL_COMMUNITY): Payer: Self-pay

## 2024-01-05 ENCOUNTER — Other Ambulatory Visit (HOSPITAL_COMMUNITY): Payer: Self-pay

## 2024-01-21 ENCOUNTER — Other Ambulatory Visit: Payer: Self-pay

## 2024-01-21 MED ORDER — SUCRALFATE 1 G PO TABS: 1.0000 g | ORAL_TABLET | Freq: Four times a day (QID) | ORAL | 5 refills | Status: AC | PRN

## 2024-01-21 NOTE — Progress Notes (Signed)
 Refill sent for Carafate  as requested

## 2024-01-24 ENCOUNTER — Other Ambulatory Visit (HOSPITAL_COMMUNITY): Payer: Self-pay

## 2024-01-25 ENCOUNTER — Other Ambulatory Visit (HOSPITAL_COMMUNITY): Payer: Self-pay

## 2024-01-26 ENCOUNTER — Other Ambulatory Visit: Payer: Self-pay

## 2024-02-24 ENCOUNTER — Other Ambulatory Visit: Payer: Self-pay

## 2024-04-01 ENCOUNTER — Other Ambulatory Visit (HOSPITAL_COMMUNITY): Payer: Self-pay

## 2024-04-21 ENCOUNTER — Other Ambulatory Visit: Payer: Self-pay | Admitting: Obstetrics & Gynecology

## 2024-04-21 DIAGNOSIS — Z1231 Encounter for screening mammogram for malignant neoplasm of breast: Secondary | ICD-10-CM

## 2024-04-24 ENCOUNTER — Other Ambulatory Visit (HOSPITAL_BASED_OUTPATIENT_CLINIC_OR_DEPARTMENT_OTHER): Payer: Self-pay

## 2024-04-24 ENCOUNTER — Other Ambulatory Visit (HOSPITAL_COMMUNITY): Payer: Self-pay

## 2024-04-24 MED ORDER — MOUNJARO 7.5 MG/0.5ML ~~LOC~~ SOAJ
7.5000 mg | SUBCUTANEOUS | 3 refills | Status: DC
  Filled 2024-04-24: qty 2, 28d supply, fill #0
  Filled ????-??-??: fill #1

## 2024-05-26 ENCOUNTER — Encounter (HOSPITAL_COMMUNITY): Payer: Self-pay

## 2024-05-26 ENCOUNTER — Other Ambulatory Visit: Payer: Self-pay

## 2024-05-26 ENCOUNTER — Other Ambulatory Visit (HOSPITAL_COMMUNITY): Payer: Self-pay

## 2024-06-05 ENCOUNTER — Ambulatory Visit
Admission: RE | Admit: 2024-06-05 | Discharge: 2024-06-05 | Disposition: A | Discharge status: Home / Self Care | Source: Ambulatory Visit | Attending: Obstetrics & Gynecology | Admitting: Obstetrics & Gynecology

## 2024-06-05 DIAGNOSIS — Z1231 Encounter for screening mammogram for malignant neoplasm of breast: Secondary | ICD-10-CM

## 2024-06-06 ENCOUNTER — Other Ambulatory Visit (HOSPITAL_COMMUNITY)
Admission: RE | Admit: 2024-06-06 | Discharge: 2024-06-06 | Disposition: A | Source: Ambulatory Visit | Attending: Obstetrics & Gynecology | Admitting: Obstetrics & Gynecology

## 2024-06-06 ENCOUNTER — Ambulatory Visit (HOSPITAL_BASED_OUTPATIENT_CLINIC_OR_DEPARTMENT_OTHER): Admitting: Obstetrics & Gynecology

## 2024-06-06 ENCOUNTER — Encounter (HOSPITAL_BASED_OUTPATIENT_CLINIC_OR_DEPARTMENT_OTHER): Payer: Self-pay | Admitting: Obstetrics & Gynecology

## 2024-06-06 VITALS — BP 137/92 | HR 67 | Ht 64.0 in | Wt 156.0 lb

## 2024-06-06 DIAGNOSIS — Z9884 Bariatric surgery status: Secondary | ICD-10-CM | POA: Diagnosis not present

## 2024-06-06 DIAGNOSIS — Z780 Asymptomatic menopausal state: Secondary | ICD-10-CM

## 2024-06-06 DIAGNOSIS — D219 Benign neoplasm of connective and other soft tissue, unspecified: Secondary | ICD-10-CM

## 2024-06-06 DIAGNOSIS — M810 Age-related osteoporosis without current pathological fracture: Secondary | ICD-10-CM | POA: Diagnosis not present

## 2024-06-06 DIAGNOSIS — Z01419 Encounter for gynecological examination (general) (routine) without abnormal findings: Secondary | ICD-10-CM | POA: Diagnosis not present

## 2024-06-06 DIAGNOSIS — Z1151 Encounter for screening for human papillomavirus (HPV): Secondary | ICD-10-CM

## 2024-06-06 DIAGNOSIS — Z124 Encounter for screening for malignant neoplasm of cervix: Secondary | ICD-10-CM | POA: Diagnosis present

## 2024-06-06 NOTE — Progress Notes (Signed)
 ANNUAL EXAM Patient name: Katherine Todd MRN [id number]  Date of birth: [birth year] Chief Complaint:   Annual Exam  History of Present Illness:   Katherine Todd is a 58 y.o. G0P0000 Caucasian female being seen today for a routine annual exam.  Having a stressful day.  Works in [employer].  Aware blood pressure is mildly elevated.  From health standpoint, doing well.    Denies vaginal bleeding.    Patient's last menstrual period was 09/14/2010.   Last pap 03/12/2021. Results were: NILM w/ HRHPV negative. H/O abnormal pap: no Last mammogram: 06/05/2024 - results still pending. She did have one on 06/04/2023 as well and the results were normal. . Family h/o breast cancer: no Last colonoscopy: 04/15/2023. Results were: abnormal Hyperplastic polyp. Family h/o colorectal cancer: yes maternal grandfather.     06/06/2024   11:01 AM 04/27/2023    8:23 AM 04/10/2022    8:30 AM 04/10/2022    8:28 AM 03/12/2021    8:16 AM  Depression screen PHQ 2/9  Decreased Interest 0 0 0 0 0  Down, Depressed, Hopeless 0 0 0 0 0  PHQ - 2 Score 0 0 0 0 0    Review of Systems:   Pertinent items are noted in HPI Denies any bladder or bowel changes.  Denies pelvic pain.   Pertinent History Reviewed:  Reviewed past medical,surgical, social and family history.  Reviewed problem list, medications and allergies. Physical Assessment:   Vitals:   06/06/24 1054  BP: (!) 137/92  Pulse: 67  SpO2: 100%  Weight: 156 lb (70.8 kg)  Height: 5' 4 (1.626 m)  Body mass index is 26.78 kg/m.        Physical Examination:   General appearance - well appearing, and in no distress  Mental status - alert, oriented to person, place, and time  Psych:  She has a normal mood and affect  Skin - multiple purplish, flat lesion on legs and buttocks  Chest - effort normal, all lung fields clear to auscultation bilaterally  Heart - normal rate and regular rhythm  Neck:  midline trachea, no thyromegaly or  nodules  Breasts - breasts appear normal, no suspicious masses, no skin or nipple changes or  axillary nodes  Abdomen - soft, nontender, nondistended, no masses or organomegaly  Pelvic - VULVA: normal appearing vulva with no masses, tenderness or lesions   VAGINA: normal appearing vagina with normal color and discharge, no lesions   CERVIX: normal appearing cervix without discharge or lesions, no CMT  Thin prep pap is updated today with HR HPV  UTERUS: uterus is felt to be normal size, shape, consistency and nontender   ADNEXA: No adnexal masses or tenderness noted.  Rectal - normal rectal, good sphincter tone, no masses felt.   Extremities:  No swelling or varicosities noted  Chaperone present for exam  No results found for this or any previous visit (from the past 24 hours).  Assessment & Plan:  1. Well woman exam with routine gynecological exam (Primary) - Pap smear updated today with HR HPV testing - Mammogram 06/05/2024 - Colonoscopy 04/15/2023 - Bone mineral density followed by Dr. Jakie - lab work done with PCP, Dr. Jakie - vaccines reviewed/updated   2. Myxoma - removed with partial colectomy last year.  Released from Dr. Joleen care.  Doing well.  3. Cervical cancer screening - Cytology - PAP( [facility])  4. History of gastric bypass  5. Age-related osteoporosis without current pathological fracture -  on Prolia   6. Postmenopausal    No orders of the defined types were placed in this encounter.   Meds: No orders of the defined types were placed in this encounter.   Follow-up: Return in about 1 year (around 06/06/2025).  Ronal GORMAN Pinal, MD 06/06/2024 12:07 PM

## 2024-06-07 ENCOUNTER — Other Ambulatory Visit (HOSPITAL_COMMUNITY): Payer: Self-pay | Admitting: Internal Medicine

## 2024-06-07 DIAGNOSIS — M810 Age-related osteoporosis without current pathological fracture: Secondary | ICD-10-CM | POA: Insufficient documentation

## 2024-06-08 LAB — CYTOLOGY - PAP
Comment: NEGATIVE
Diagnosis: NEGATIVE
High risk HPV: NEGATIVE

## 2024-06-09 ENCOUNTER — Ambulatory Visit (HOSPITAL_BASED_OUTPATIENT_CLINIC_OR_DEPARTMENT_OTHER): Payer: Self-pay | Admitting: Obstetrics & Gynecology

## 2024-06-19 ENCOUNTER — Other Ambulatory Visit: Payer: Self-pay

## 2024-06-21 ENCOUNTER — Telehealth: Payer: Self-pay | Admitting: Gastroenterology

## 2024-06-21 NOTE — Telephone Encounter (Signed)
 May be best to see her in the office and discuss. She has had a very difficult colonoscopy in the past and s/p surgery. Thanks

## 2024-06-21 NOTE — Telephone Encounter (Signed)
 Patient is advised of Dr Hassan recommendation for office visit to discuss colonoscopy timing following previous surgeries and difficult colonoscopies. She is in agreement with this and has scheduled an appointment for 09/08/24 at 921 am with Dr Leigh.

## 2024-06-21 NOTE — Telephone Encounter (Signed)
 Dr Leigh-  Would you look at 04/2023 colonoscopy and advise on appropriate recall date?  04/2023 prep was noted to be inadequate. Splenic flexure mass was found and surgery recommended. Hyperplastic polyp was also found and colonoscopy recall was recommended at that time for 3 years (2027).  08/2023, Dr Teresa with [facility] completed splenic flexure resection:  - Benign myxoma/plexiform fibromyxoma, 3 cm, involving the splenic  flexure  - Margins appear viable  - Thirteen benign lymph nodes  - No evidence of malignancy

## 2024-06-21 NOTE — Telephone Encounter (Signed)
 Inbound call from patient stating she was advised previously that she would be due for a repeat colonoscopy in December. Patient is requesting for this to be confirmed due to recall stating 2027. Please advise, thank you

## 2024-06-24 ENCOUNTER — Encounter: Payer: Self-pay | Admitting: Gastroenterology

## 2024-06-28 ENCOUNTER — Other Ambulatory Visit (HOSPITAL_COMMUNITY): Payer: Self-pay | Admitting: Internal Medicine

## 2024-06-28 ENCOUNTER — Telehealth (HOSPITAL_COMMUNITY): Payer: Self-pay | Admitting: Pharmacy Technician

## 2024-06-28 NOTE — Telephone Encounter (Signed)
 NOTE: Per [facility] practice, MD approved biosimilars switch for Prolia  per payor preferences.  Auth Submission: NO AUTH NEEDED Site of care: [facility] Payer: BCBS Medication & CPT/J Code(s) submitted: [id number] [facility] Diagnosis Code: M81.0 Route of submission (phone, fax, portal):  Phone # Fax # Auth type: Buy/Bill HB Units/visits requested: 60mg  x 2 doses, q 6 months Reference number:  Approval from: 06/28/2024 to 09/13/24    Katherine Todd, CPhT [facility] Phone: [phone] 06/28/2024

## 2024-07-14 ENCOUNTER — Ambulatory Visit (HOSPITAL_COMMUNITY)
Admission: RE | Admit: 2024-07-14 | Discharge: 2024-07-14 | Disposition: A | Discharge status: Home / Self Care | Source: Ambulatory Visit | Attending: Internal Medicine | Admitting: Internal Medicine

## 2024-07-14 VITALS — BP 116/84 | HR 83 | Temp 97.7°F | Resp 16

## 2024-07-14 DIAGNOSIS — M810 Age-related osteoporosis without current pathological fracture: Secondary | ICD-10-CM | POA: Diagnosis present

## 2024-07-14 MED ORDER — DENOSUMAB-BNHT 60 MG/ML ~~LOC~~ SOSY
60.0000 mg | PREFILLED_SYRINGE | Freq: Once | SUBCUTANEOUS | Status: AC
  Administered 2024-07-14: 60 mg via SUBCUTANEOUS
  Filled 2024-07-14: qty 1

## 2024-07-17 ENCOUNTER — Other Ambulatory Visit: Payer: Self-pay

## 2024-07-22 ENCOUNTER — Other Ambulatory Visit (HOSPITAL_COMMUNITY): Payer: Self-pay

## 2024-08-15 ENCOUNTER — Other Ambulatory Visit: Payer: Self-pay

## 2024-09-08 ENCOUNTER — Ambulatory Visit: Admitting: Gastroenterology

## 2024-09-08 ENCOUNTER — Encounter: Payer: Self-pay | Admitting: Gastroenterology

## 2024-09-08 VITALS — BP 110/60 | HR 84 | Ht 64.0 in | Wt 156.0 lb

## 2024-09-08 DIAGNOSIS — M810 Age-related osteoporosis without current pathological fracture: Secondary | ICD-10-CM

## 2024-09-08 DIAGNOSIS — Z79899 Other long term (current) drug therapy: Secondary | ICD-10-CM

## 2024-09-08 DIAGNOSIS — K219 Gastro-esophageal reflux disease without esophagitis: Secondary | ICD-10-CM

## 2024-09-08 DIAGNOSIS — Z860100 Personal history of colon polyps, unspecified: Secondary | ICD-10-CM | POA: Diagnosis not present

## 2024-09-08 MED ORDER — OMEPRAZOLE 20 MG PO CPDR: 20.0000 mg | DELAYED_RELEASE_CAPSULE | Freq: Every day | ORAL | 0 refills | Status: DC

## 2024-09-08 NOTE — Patient Instructions (Addendum)
 _______________________________________________________  If your blood pressure at your visit was 140/90 or greater, please contact your primary care physician to follow up on this.  _______________________________________________________  If you are age 59 or older, your body mass index should be between 23-30. Your Body mass index is 26.78 kg/m. If this is out of the aforementioned range listed, please consider follow up with your Primary Care Provider.  If you are age 93 or younger, your body mass index should be between 19-25. Your Body mass index is 26.78 kg/m. If this is out of the aformentioned range listed, please consider follow up with your Primary Care Provider.   ________________________________________________________  The [place] providers would like to encourage you to use MYCHART to communicate with providers for non-urgent requests or questions.  Due to long hold times on the telephone, sending your provider a message by [facility] may be a faster and more efficient way to get a response.  Please allow 48 business hours for a response.  Please remember that this is for non-urgent requests.  _______________________________________________________  [facility] is using a team-based approach to care.  Your team is made up of your doctor and two to three APPS. Our APPS (Nurse Practitioners and Physician Assistants) work with your physician to ensure care continuity for you. They are fully qualified to address your health concerns and develop a treatment plan. They communicate directly with your gastroenterologist to care for you. Seeing the Advanced Practice Practitioners on your physician's team can help you by facilitating care more promptly, often allowing for earlier appointments, access to diagnostic testing, procedures, and other specialty referrals.   Use Carafate  as needed. Decrease omeprazole  to 20mg  as tolerated. Continue pepcid 2 times a day  You have been  scheduled for a colonoscopy. Please follow written instructions given to you at your visit today.   If you use inhalers (even only as needed), please bring them with you on the day of your procedure.  DO NOT TAKE 7 DAYS PRIOR TO TEST- Trulicity (dulaglutide) Ozempic, Wegovy (semaglutide) Mounjaro , Zepbound  (tirzepatide ) Bydureon Bcise (exanatide extended release)  DO NOT TAKE 1 DAY PRIOR TO YOUR TEST Rybelsus (semaglutide) Adlyxin (lixisenatide) Victoza (liraglutide) Byetta (exanatide) ___________________________________________________________________________

## 2024-09-08 NOTE — Progress Notes (Signed)
 "  HPI :  58 year old female here for a follow-up visit-history of colon polyps including history of large colonic plexiform fibromyxoma, history of mini gastric bypass/GERD.  Recall back in November 2023, she was found to have a very large pedunculated lesion in the descending colon that on biopsies returned as an inflammatory polyp. CT was performed was performed that showed an intraluminal mass but no evidence of any other significant abnormality.  I referred her to Dr. Wilhelmenia for removal of this given the size of it.  He did an EUS and then removed it in Feb 2024.  This was a very large lesion, greater than 6 cm in size.  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  She had a follow-up CT enterography done given reported history of intussusception in the past, this did not show any concerning small bowel lesions but they could see the site of the polypectomy with potentially some residual polyp there.  He repeated a colonoscopy on April 15, 2023 showing no residual intraluminal evidence of the lesion but she did have a subepithelial component.  Her prep was inadequate on that exam of note.  Her case was discussed at multidisciplinary conference, ultimately recommended referral to colorectal surgery given the rare nature of this lesion to discuss surgical resection.  Ultimately she met with Dr. Teresa, they proceeded with surgical resection of the splenic flexure and remove this lesion approximately 1 year ago.  She states she recovered from the surgery and has done quite well since then.  Since the surgery she has been feeling very well.  She states her bowels in general have not been bothering her.  Denies any constipation, denies any diarrhea.  She has been on Mounjaro  for the past 3-1/2 years or so and thinks she tolerates it pretty well.  She states she has vomited some of her prep in the past and does better with MiraLAX /Gatorade prep.  Dr.  Teresa had suggested a colonoscopy 1 year after her surgical resection.  We discussed if she would she want to pursue that.  She does have a history of colon polyps otherwise.  Of note, she has very difficult colonoscopy for cecal intubation given tortuosity of her colon.  She has significant adhesive disease after her gastric bypass this was noted surgically.  Otherwise, recall she has had some worsening reflux symptoms over the past few years.  She had 2 endoscopies last year, the first of which showed severe esophageal candidiasis for which she was treated with fluconazole .  She had recurrent symptoms, treated again without improvement.  Repeat EGD performed in September showing no esophageal candidiasis, biopsies of her esophagus showed changes of reflux only and she otherwise had normal surgical bypass anatomy.  We escalated her omeprazole  to capsules, crack open 40 mg twice daily prior to ingestion, continued Pepcid 20 mg twice daily and gave her Carafate  as needed.  She states over time this has really helped her and her symptoms are much better controlled.  She is been doing pretty well in regards to her reflux since have last seen her.  She has been able to decrease her omeprazole  from twice daily dosing to once daily, now at 40 mg capsule once daily.  She states generally this has worked well for her over the past few months.  She continues to take Pepcid 20 mg twice daily and Carafate  every morning.  She has not sure she needs the Carafate  anymore.  She has very rare breakthrough, no dysphagia, otherwise doing well.  Of note she does have osteoporosis, is on dedicated therapy for this, due for another DEXA scan in March.  We discussed long-term management of her reflux and options for that.     GI Procedures and Studies  November 2023 Colonoscopy - Two 3 to 4 mm polyps in the ascending colon, removed with a cold snare. Resected and retrieved. - One 5 mm polyp at the hepatic flexure, removed with a  cold snare. Resected and retrieved. - One large polypoid lesion in the descending colon as outlined. This may be a benign / inflamed lipomatous lesion, will await biopsy results. Not removed today given bleeding risk. Tattooed. - Internal hemorrhoids. - Tortuous colon with significant looping.       Colonoscopy 10/15/22 - Dr. Wilhelmenia - large 65mm lesion removed via EMR following EUS, 2 other small polyps removed.   FINAL MICROSCOPIC DIAGNOSIS:   A. COLON, ASCENDING, HEPATIC FLEXURE, POLYPECTOMY:  Sessile serrated adenoma (s) without cytologic dysplasia.   B. COLON, DESCENDING, POLYPECTOMY:  Polypoid benign spindle cell proliferation with myxoid stroma.  Negative for dysplasia or malignancy.  See comment.   COMMENT:  The ascending polyp shows a submucosal spindle cell proliferation with  myxoid stroma interspersed with delicate thin-walled vessels.  The  spindle cells do not show significant atypia or mitotic activity and  there is no necrosis.  This is a very rare lesion in the colon with very  few case reports in the literature some of which use the term plexiform  fibromyxoma and others use the term be      Imaging Studies  December 2023 CT abdomen pelvis with contrast IMPRESSION: 5.6 cm low-attenuation intraluminal mass in proximal descending colon, without internal fat or other specific characteristics. Mucinous neoplasm and malignancy cannot be excluded. Short-segment small bowel intussusception in the pelvis. These are typically idiopathic and self-limited. Consider short-term imaging follow-up with CT enterography. No evidence of metastatic disease within the abdomen or pelvis.     CT enterography 11/04/22: IMPRESSION: Previously seen small bowel intussusception in the pelvis is no longer visualized.   Decreased size of 3.4 cm intraluminal mass along the medial wall of the proximal descending colon. This may be due to partial resection; recommend correlation with  recent procedure history.   No evidence of abdominal or pelvic metastatic disease.   Large stool burden noted; recommend correlation for symptoms or signs of constipation.       Celiac serologies negative 12/2022 Pancreatic elastase 321   EGD 04/15/23 White and yellowish nummular lesions were noted in the entire esophagus. Biopsies were taken with a cold forceps for histology. Findings: A non-obstructing Schatzki ring was found at the gastroesophageal junction. The Z-line was irregular and was found 33 cm from the incisors. Evidence of a gastric bypass was found. A gastric pouch with a 7 cm length from the GE junction to the gastrojejunal anastomosis was found. The gastrojejunal anastomosis was characterized by healthy appearing mucosa. This was traversed. The pouch-to-jejunum limb was characterized by healthy appearing mucosa. Biopsies were taken with a cold forceps for histology. Normal mucosa was found in the visualized apparent afferent and efferent jejunum. Biopsies were taken with a cold forceps for histology.  Colonoscopy 04/15/23 - Preparation of the colon was inadequate even after copious lavage. - Hemorrhoids found on digital rectal exam. - There was significant looping of the colon. A medium post mucosectomy scar was found in the proximal descending colon. The scar  tissue was healthy in appearance. This is just proximal to the previously placed tattoo. This is at the area of the plexiform fibromyxoma. There is no longer an intraluminal component to this this looks to be just subepithelial at this time. No additional endoscopic resection is able to be performed. A 3 mm polyp was found in the recto-sigmoid colon. The polyp was sessile. The polyp was removed with a cold snare. Resection and retrieval were complete. Non-bleeding non-thrombosed external and internal hemorrhoids were found during retroflexion, during perianal exam and during digital exam. The hemorrhoids were Grade II (internal  hemorrhoids that prolapse but reduce spontaneously).   FINAL MICROSCOPIC DIAGNOSIS:   A. ESOPHAGUS, BIOPSY:  - Acute nonspecific esophagitis (see comment)  - PAS stain is positive for fungal organisms  - Negative for dysplasia or malignancy   B. STOMACH, BIOPSY:  - Gastric antral and oxyntic mucosa with mild reactive changes  - Negative for H. pylori on HE stain  - Negative for intestinal metaplasia, dysplasia or malignancy   C. JEJUNUM, BIOPSY:  - Benign small bowel mucosa with no significant pathologic changes   D. COLON, SIGMOID, POLYPECTOMY:  - Hyperplastic polyp.  - No dysplasia or malignancy.      CT abdomen / pelvis 04/28/23: FINDINGS: Lower chest: No acute findings. Heart is at the upper limits of normal in size to mildly enlarged. No pericardial or pleural effusion. Distal esophagus is grossly unremarkable.   Hepatobiliary: Liver is unremarkable. Cholecystectomy. No unexpected biliary ductal dilatation.   Pancreas: Negative.   Spleen: Negative.   Adrenals/Urinary Tract: Adrenal glands and kidneys are unremarkable. Ureters are decompressed. Bladder is grossly unremarkable.   Stomach/Bowel: Small hiatal hernia. Gastric bypass. Stomach, small bowel and appendix are otherwise unremarkable. Fair amount of stool in the colon is indicative of constipation. Rounded polypoid lesion in the proximal descending colon measures 2.2 x 2.2 cm (2/17), similar to 11/04/2022.   Vascular/Lymphatic: Vascular structures are unremarkable. No pathologically enlarged lymph nodes. Circumaortic left renal vein.   Reproductive: Uterus is visualized.  No adnexal mass.   Other: No free fluid. Mesenteries and peritoneum are unremarkable. Left paramidline supraumbilical ventral hernia repair with slight residual laxity of the ventral abdominal wall.   Musculoskeletal: Degenerative changes in the spine. Bone island in the posterior right ninth rib. Levoconvex scoliosis. Hemangioma  in the L3 vertebral body.   IMPRESSION: Rounded polypoid lesion in the proximal descending colon, stable in size.    EGD 06/10/23: - The Z-line was regular and was found 33 cm from the incisors. - A widely patent Schatzki ring was found at the gastroesophageal junction. - A single 3 mm polyp was found 16 cm from the incisors. Appears to be a benign squamous papilloma. The polyp was removed with a cold biopsy forceps. Resection and retrieval were complete. - The exam of the esophagus was otherwise normal. NO erosive changes. NO overt evidence of candidiasis. - Biopsies were obtained from the proximal and distal esophagus with cold forceps for histology. - Evidence of a gastric bypass was found. A gastric pouch was found, roughly 10cm in length. Mildly erythematous. Previously biopsies ruled out H pylori, no additional biopsies taken today. . - The exam of the gastric pouch was otherwise normal. Given narrowed lumen retroflexed views of the cardia not obtained. - The examined small bowel limb was normal.  1. Surgical [P], esophageal bx :       - MILD CHRONIC NONSPECIFIC ESOPHAGITIS WITH RARE INTRAEPITHELIAL EOSINOPHILS (UP  TO 3/HPF), SUGGESTIVE OF REFLUX ESOPHAGITIS        2. Surgical [P], esophageal polyp at 16 cm, polyp (1) :       - BENIGN SQUAMOUS PAPILLOMA     08/2023, Dr Teresa with [facility] completed splenic flexure resection:   PATH A. COLON, SPLENIC FLEXTURE, RESECTION:  - Benign myxoma/plexiform fibromyxoma, 3 cm, involving the splenic  flexure  - Margins appear viable  - Thirteen benign lymph nodes  - No evidence of malignancy      Past Medical History:  Diagnosis Date   Allergy    Anemia    Anxiety    Arthritis    Cataract    Depression    Fibrocystic breast    Fibromyalgia    11/26/16 'resolved'   GERD (gastroesophageal reflux disease)    Hyperlipidemia    Hypothyroidism    Migraines    Obesity    Osteoporosis    Sleep apnea    not currently   Thyroid  disease      Past Surgical History:  Procedure Laterality Date   BIOPSY  04/15/2023   Procedure: BIOPSY;  Surgeon: Wilhelmenia Aloha Raddle., MD;  Location: [facility];  Service: Gastroenterology;;   COLONOSCOPY WITH PROPOFOL  N/A 10/15/2022   Procedure: COLONOSCOPY WITH PROPOFOL ;  Surgeon: Wilhelmenia Aloha Raddle., MD;  Location: [facility];  Service: Gastroenterology;  Laterality: N/A;   COLONOSCOPY WITH PROPOFOL  N/A 04/15/2023   Procedure: COLONOSCOPY WITH PROPOFOL ;  Surgeon: Mansouraty, Aloha Raddle., MD;  Location: [facility];  Service: Gastroenterology;  Laterality: N/A;   ENDOSCOPIC MUCOSAL RESECTION N/A 10/15/2022   Procedure: ENDOSCOPIC MUCOSAL RESECTION;  Surgeon: Wilhelmenia Aloha Raddle., MD;  Location: [facility];  Service: Gastroenterology;  Laterality: N/A;   ESOPHAGOGASTRODUODENOSCOPY (EGD) WITH PROPOFOL  N/A 04/15/2023   Procedure: ESOPHAGOGASTRODUODENOSCOPY (EGD) WITH PROPOFOL ;  Surgeon: Wilhelmenia Aloha Raddle., MD;  Location: [facility];  Service: Gastroenterology;  Laterality: N/A;   EUS N/A 10/15/2022   Procedure: LOWER ENDOSCOPIC ULTRASOUND (EUS);  Surgeon: Wilhelmenia Aloha Raddle., MD;  Location: [facility];  Service: Gastroenterology;  Laterality: N/A;   GASTRIC BYPASS  04/2007, revision 2009 and 2011   Mini gastric bypass   HEMOSTASIS CLIP PLACEMENT  10/15/2022   Procedure: HEMOSTASIS CLIP PLACEMENT;  Surgeon: Wilhelmenia Aloha Raddle., MD;  Location: [facility];  Service: Gastroenterology;;   HEMOSTASIS CONTROL  10/15/2022   Procedure: HEMOSTASIS CONTROL;  Surgeon: Wilhelmenia Aloha Raddle., MD;  Location: [facility];  Service: Gastroenterology;;   HERNIA REPAIR  2009   incisional repair from gastsric bypass   LAPAROSCOPIC CHOLECYSTECTOMY  09/2002   Dr. Gretel GRUBER OF ADHESION N/A 08/19/2023   Procedure: LYSIS OF ADHESION;  Surgeon: Teresa Lonni HERO, MD;  Location: [facility];  Service: General;  Laterality: N/A;   POLYPECTOMY  10/15/2022   Procedure: POLYPECTOMY;  Surgeon:  Wilhelmenia Aloha Raddle., MD;  Location: [facility];  Service: Gastroenterology;;   POLYPECTOMY  04/15/2023   Procedure: POLYPECTOMY;  Surgeon: Wilhelmenia Aloha Raddle., MD;  Location: [facility];  Service: Gastroenterology;;   TONSILLECTOMY AND ADENOIDECTOMY  age  58   WISDOM TOOTH EXTRACTION  age 50   Family History  Problem Relation Age of Onset   Hyperlipidemia Mother    Prostate cancer Father 71   Thyroid disease Father    Melanoma Brother 35       back of knee    Stroke Maternal Grandmother    Colon cancer Maternal Grandfather 18       colon caner   Pancreatic cancer Paternal  Grandmother            Breast cancer Neg Hx    Esophageal cancer Neg Hx    Stomach cancer Neg Hx    Rectal cancer Neg Hx    Inflammatory bowel disease Neg Hx    Liver disease Neg Hx    Social History[1] Current Outpatient Medications  Medication Sig Dispense Refill   acetaminophen  (TYLENOL ) 500 MG tablet Take 1,000 mg by mouth 2 (two) times daily.     ARIPiprazole  (ABILIFY ) 5 MG tablet Take 2.5 mg by mouth in the morning.     bisacodyl  (DULCOLAX) 5 MG EC tablet Take 5 mg by mouth once.     buPROPion  (WELLBUTRIN  XL) 300 MG 24 hr tablet Take 300 mg by mouth in the morning.     calcium  carbonate (TUMS - DOSED IN MG ELEMENTAL CALCIUM ) 500 MG chewable tablet Chew 1 tablet by mouth in the morning.     Cholecalciferol (VITAMIN D3 PO) Place 5,000 Units into both eyes in the morning.     clonazePAM  (KLONOPIN ) 0.5 MG tablet Take 0.5 mg by mouth 2 (two) times daily as needed for anxiety.     denosumab  (PROLIA ) 60 MG/ML SOSY injection Inject 60 mg into the skin every 6 (six) months.     famotidine (PEPCID) 20 MG tablet Take 20 mg by mouth 2 (two) times daily.     levothyroxine  (SYNTHROID ) 50 MCG tablet Take 50 mcg by mouth daily before breakfast.     lovastatin (MEVACOR) 20 MG tablet Take 20 mg by mouth once a week.     neomycin  (MYCIFRADIN ) 500 MG tablet Take 1,000 mg by mouth 3 (three) times daily.      omeprazole  (PRILOSEC) 40 MG capsule Take 1 capsule (40 mg total) by mouth 2 (two) times daily before a meal. Crack capsules open prior to ingestion     ondansetron  (ZOFRAN -ODT) 4 MG disintegrating tablet Take 1 tablet (4 mg total) by mouth every 6 (six) hours as needed for nausea or vomiting. 30 tablet 1   OVER THE COUNTER MEDICATION Apply 1 application  topically 3 (three) times a week. Hairstim     polycarbophil (FIBERCON) 625 MG tablet Take 625 mg by mouth 2 (two) times daily.     sucralfate  (CARAFATE ) 1 g tablet Take 1 tablet (1 g total) by mouth every 6 (six) hours as needed. Dissolve 1 tablet in a TBSP of distilled water and drink as a slurry 120 tablet 5   SUMAtriptan  (IMITREX ) 100 MG tablet TAKE 1 TABLET BY MOUTH ONCE AS NEEDED FOR MIGRAINE(MAY REPEAT IN 2 HORUS. MAX OF 2 TABLETS PER 24 HOURS)     tirzepatide  (MOUNJARO ) 10 MG/0.5ML Pen Inject 10 mg into the skin once a week. 2 mL 3   Vitamin D, Ergocalciferol, (DRISDOL) 50000 UNITS CAPS Take 50,000 Units by mouth See admin instructions. Take 1 capsule twice daily on Mondays through Fridays.     vortioxetine  HBr (TRINTELLIX ) 20 MG TABS tablet Take 20 mg by mouth in the morning.     cetirizine (ZYRTEC) 10 MG tablet Take 10 mg by mouth at bedtime. (Patient not taking: Reported on 09/08/2024)     cyanocobalamin  (VITAMIN B12) 250 MCG tablet Take 250 mcg by mouth every evening. (Patient not taking: Reported on 09/08/2024)     Multiple Vitamin (MULTIVITAMIN WITH MINERALS) TABS tablet Take 1 tablet by mouth in the morning and at bedtime. (Patient not taking: Reported on 09/08/2024)     Multiple Vitamins-Minerals (OCUVITE EXTRA  PO) Take 1 tablet by mouth at bedtime. (Patient not taking: Reported on 09/08/2024)     propranolol (INDERAL) 10 MG tablet Take 1 tablet by mouth daily as needed (difficulty driving over bridges). (Patient not taking: Reported on 09/08/2024)  12   traMADol  (ULTRAM ) 50 MG tablet Take 1 tablet (50 mg total) by mouth every 6 (six)  hours as needed (postop pain not controlled with ibuprofen  first). (Patient not taking: Reported on 09/08/2024) 30 tablet 0   vitamin E 180 MG (400 UNITS) capsule Take 400 Units by mouth at bedtime. (Patient not taking: Reported on 09/08/2024)     No current facility-administered medications for this visit.   Allergies[2]   Review of Systems: All systems reviewed and negative except where noted in HPI.     Physical Exam: BP 110/60   Pulse 84   Ht 5' 4 (1.626 m)   Wt 156 lb (70.8 kg)   LMP 09/14/2010   BMI 26.78 kg/m  Constitutional: Pleasant,well-developed, female in no acute distress. Neurological: Alert and oriented to person place and time. Psychiatric: Normal mood and affect. Behavior is normal.   ASSESSMENT: 58 y.o. female here for assessment of the following  1. History of colon polyps   2. Gastroesophageal reflux disease, unspecified whether esophagitis present   3. Long-term current use of proton pump inhibitor therapy   4. Osteoporosis, unspecified osteoporosis type, unspecified pathological fracture presence    As above, history of colon polyps including large benign plexiform fibromyxoma that was attempted to be removed endoscopically per Dr. Wilhelmenia but large subepithelial component that could not be removed endoscopically.  Following imaging and discussion at multidisciplinary conference, ultimately referred to colorectal surgery and had colonic resection 1 year ago.  Negative lymph nodes.  We discussed the rarity of this growth and plans moving forward.  It was suggested she have a repeat colonoscopy 1 year after surgical resection.  We discussed this for a bit.  She denies any cardiopulmonary symptoms and strongly wants to have a colonoscopy to reassess things.  Hopefully no evidence of recurrence and we can space out her colonoscopies further moving forward.  We discussed she has a technically challenging colonoscopy for cecal intubation due to looping and  tortuosity.  Further, she had an inadequate prep on her last exam.  She prefers to use Gatorade/MiraLAX  prep as she has vomited preps in the past, will use this to give her a prep and a half for her next exam.  Further recommendations pending results.  We otherwise discussed her reflux history, endoscopic history.  Her symptoms are much better controlled now than the last time I saw her.  Her EGD shows no evidence of Barrett's esophagus.  We discussed her regimen, and long-term risks of chronic PPI.  She understands she is at increased risk for fracture on chronic PPI and she already has osteoporosis, requiring therapy.  Long-term want to really minimize the amount of PPI she has for this.  We will try to taper down her omeprazole  to 20 mg once daily and see how she does.  Over time if she can manage with Pepcid monotherapy that would be best.  She will continue Pepcid 20 mg twice daily for now.  I think she can stop the Carafate  altogether and use as needed.  She will stop the Carafate  first, she how she does for a few weeks and then taper down the omeprazole  as tolerated.  If she cannot tolerate and needs to increase her dosing she should  let me know.  She understands risks of chronic PPI if needed long-term   PLAN: - schedule colonoscopy in the [facility] - Gatorade / Miralax  bowel prep - using prep and half for her exam given inadequate prep on the last exam - back off carafate , stop routine use and only use prn - she will continue pepcid BID for now schedule - will try backing off omeprazole  to 20mg  / day as tolerated, wean further PRN - counseled on long term risks of chronic PPI, she does have osteoporosis, at higher fracture risk - follow up yearly for GERD if not sooner with issues  Marcey Naval, MD [facility]    [1]  Social History Tobacco Use   Smoking status: Never   Smokeless tobacco: Never  Vaping Use   Vaping status: Never Used  Substance Use Topics   Alcohol use:  No   Drug use: No  [2]  Allergies Allergen Reactions   [employer] Diphedryl [Diphenhydramine ] Other (See Comments)    Heart palpitations  Heart palpitations    Atorvastatin Other (See Comments)    Lipitor= Muscle aches   Adhesive [Tape] Rash   "

## 2024-09-10 ENCOUNTER — Other Ambulatory Visit (HOSPITAL_COMMUNITY): Payer: Self-pay

## 2024-09-13 ENCOUNTER — Other Ambulatory Visit (HOSPITAL_COMMUNITY): Payer: Self-pay

## 2024-09-15 ENCOUNTER — Encounter (HOSPITAL_COMMUNITY): Payer: Self-pay | Admitting: Internal Medicine

## 2024-09-15 ENCOUNTER — Other Ambulatory Visit (HOSPITAL_COMMUNITY): Payer: Self-pay

## 2024-09-15 MED ORDER — MOUNJARO 10 MG/0.5ML ~~LOC~~ SOAJ
10.0000 mg | SUBCUTANEOUS | 5 refills | Status: AC
  Filled 2024-09-15 – 2024-09-22 (×2): qty 2, 28d supply, fill #0
  Filled 2024-10-18: qty 2, 28d supply, fill #1

## 2024-09-16 ENCOUNTER — Other Ambulatory Visit (HOSPITAL_COMMUNITY): Payer: Self-pay

## 2024-09-21 ENCOUNTER — Encounter (HOSPITAL_COMMUNITY): Payer: Self-pay

## 2024-09-21 ENCOUNTER — Other Ambulatory Visit (HOSPITAL_COMMUNITY): Payer: Self-pay

## 2024-09-22 ENCOUNTER — Other Ambulatory Visit (HOSPITAL_COMMUNITY): Payer: Self-pay

## 2024-09-23 ENCOUNTER — Encounter: Payer: Self-pay | Admitting: Gastroenterology

## 2024-09-23 ENCOUNTER — Other Ambulatory Visit (HOSPITAL_COMMUNITY): Payer: Self-pay

## 2024-10-04 ENCOUNTER — Other Ambulatory Visit: Payer: Self-pay | Admitting: Medical Genetics

## 2024-10-11 ENCOUNTER — Encounter: Payer: Self-pay | Admitting: Gastroenterology

## 2024-10-11 ENCOUNTER — Ambulatory Visit: Admitting: Gastroenterology

## 2024-10-11 VITALS — BP 117/77 | HR 80 | Temp 97.5°F | Resp 16 | Ht 64.0 in | Wt 156.0 lb

## 2024-10-11 DIAGNOSIS — Z860101 Personal history of adenomatous and serrated colon polyps: Secondary | ICD-10-CM

## 2024-10-11 DIAGNOSIS — Q439 Congenital malformation of intestine, unspecified: Secondary | ICD-10-CM

## 2024-10-11 DIAGNOSIS — K623 Rectal prolapse: Secondary | ICD-10-CM

## 2024-10-11 DIAGNOSIS — Z860100 Personal history of colon polyps, unspecified: Secondary | ICD-10-CM

## 2024-10-11 DIAGNOSIS — Z1211 Encounter for screening for malignant neoplasm of colon: Secondary | ICD-10-CM

## 2024-10-11 DIAGNOSIS — K648 Other hemorrhoids: Secondary | ICD-10-CM

## 2024-10-11 DIAGNOSIS — D128 Benign neoplasm of rectum: Secondary | ICD-10-CM | POA: Diagnosis not present

## 2024-10-11 MED ORDER — SODIUM CHLORIDE 0.9 % IV SOLN: 500.0000 mL | Freq: Once | INTRAVENOUS | Status: AC

## 2024-10-11 NOTE — Patient Instructions (Signed)
 YOU HAD AN ENDOSCOPIC PROCEDURE TODAY AT THE [facility]:   Refer to the procedure report that was given to you for any specific questions about what was found during the examination.  If the procedure report does not answer your questions, please call your gastroenterologist to clarify.  If you requested that your care partner not be given the details of your procedure findings, then the procedure report has been included in a sealed envelope for you to review at your convenience later.  YOU SHOULD EXPECT: Some feelings of bloating in the abdomen. Passage of more gas than usual.  Walking can help get rid of the air that was put into your GI tract during the procedure and reduce the bloating. If you had a lower endoscopy (such as a colonoscopy or flexible sigmoidoscopy) you may notice spotting of blood in your stool or on the toilet paper. If you underwent a bowel prep for your procedure, you may not have a normal bowel movement for a few days.  Please Note:  You might notice some irritation and congestion in your nose or some drainage.  This is from the oxygen used during your procedure.  There is no need for concern and it should clear up in a day or so.  SYMPTOMS TO REPORT IMMEDIATELY:  Following lower endoscopy (colonoscopy or flexible sigmoidoscopy):  Excessive amounts of blood in the stool  Significant tenderness or worsening of abdominal pains  Swelling of the abdomen that is new, acute  Fever of 100F or higher  Resume previous diet Continue present medications Await pathology results Return to normal activities tomorrow Handout on polyps and hemorrhoids given   For urgent or emergent issues, a gastroenterologist can be reached at any hour by calling [phone]. Do not use MyChart messaging for urgent concerns.    DIET:  We do recommend a small meal at first, but then you may proceed to your regular diet.  Drink plenty of fluids but you should avoid alcoholic  beverages for 24 hours.  ACTIVITY:  You should plan to take it easy for the rest of today and you should NOT DRIVE or use heavy machinery until tomorrow (because of the sedation medicines used during the test).    FOLLOW UP: Our staff will call the number listed on your records the next business day following your procedure.  We will call around 7:15- 8:00 am to check on you and address any questions or concerns that you may have regarding the information given to you following your procedure. If we do not reach you, we will leave a message.     If any biopsies were taken you will be contacted by phone or by letter within the next 1-3 weeks.  Please call us  at [phone] if you have not heard about the biopsies in 3 weeks.    SIGNATURES/CONFIDENTIALITY: You and/or your care partner have signed paperwork which will be entered into your electronic medical record.  These signatures attest to the fact that that the information above on your After Visit Summary has been reviewed and is understood.  Full responsibility of the confidentiality of this discharge information lies with you and/or your care-partner.

## 2024-10-11 NOTE — Progress Notes (Signed)
 Pt's states no medical or surgical changes since previsit or office visit.

## 2024-10-11 NOTE — Progress Notes (Signed)
 Called to room to assist during endoscopic procedure.  Patient ID and intended procedure confirmed with present staff. Received instructions for my participation in the procedure from the performing physician.

## 2024-10-11 NOTE — Op Note (Signed)
 [facility] Patient Name: Katherine Todd Procedure Date: 10/11/2024 2:58 PM MRN: [id number] Endoscopist: Elspeth P. 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 History of significant adhesive                            disease, ventra hernia noted. Miralax  prep done for                            this exam. Medicines:                Monitored Anesthesia Care Procedure:                Pre-Anesthesia Assessment:                           - Prior to the procedure, a History and Physical                            was performed, and patient medications and                            allergies were reviewed. The patient's tolerance of                            previous anesthesia was also reviewed. The risks                            and benefits of the procedure and the sedation                            options and risks were discussed with the patient.                            All questions were answered, and informed consent                            was obtained. Prior Anticoagulants: The patient has                            taken no anticoagulant or antiplatelet agents. ASA  Grade Assessment: II - A patient with mild systemic                            disease. After reviewing the risks and benefits,                            the patient was deemed in satisfactory condition to                            undergo the procedure.                           After obtaining informed consent, the colonoscope                            was passed under direct vision. Throughout the                             procedure, the patient's blood pressure, pulse, and                            oxygen saturations were monitored continuously. The                            PCF-HQ190L Colonoscope [id number] was introduced                            through the anus with the intention of advancing to                            the cecum. The scope was advanced to the transverse                            colon before the procedure was aborted. Medications                            were given. The colonoscopy was technically                            difficult and complex due to a tortuous colon. The                            patient tolerated the procedure well. The quality                            of the bowel preparation was fair. The rectum was                            photographed. Scope In: 3:18:26 PM Scope Out: 3:36:35 PM Total Procedure Duration: 0 hours 18 minutes 9 seconds  Findings:                 The perianal and digital rectal examinations were  normal.                           There was evidence of a prior end-to-end                            colo-colonic anastomosis at the splenic flexure.                            This was patent and was characterized by healthy                            appearing mucosa.                           The colon was markedly tortuous along with                            restricted mobility. Of note, there was a very                            acutely angulated turn just proximal to the                            surgical anastomosis. I could see through proximal                            transverse colon however the scope could not                            traverse this area, felt quite restricted and                            following multiple attempts I aborted further                            attempts. Concerned about adhesive disease causing                            this and also she is  dealing with a ventral hernia,                            unclear if related. In addition the prep was fair                            at best. Significant lavage performed leading to                            fair views.                           A large amount of liquid stool was found in the  entire colon, interfering with visualization.                            Lavage of the colon was performed using copious                            amounts, resulting in clearance with continued fair                            visualization.                           Two sessile polyps were found in the rectum. The                            polyps were 2 to 3 mm in size. These polyps were                            removed with a cold snare. Resection and retrieval                            were complete.                           Internal hemorrhoids were found during retroflexion.                           The exam was otherwise without abnormality. Complications:            No immediate complications. Estimated blood loss:                            Minimal. Estimated Blood Loss:     Estimated blood loss was minimal. Impression:               - Preparation of the colon was fair.                           - Patent end-to-end colo-colonic anastomosis,                            characterized by healthy appearing mucosa.                           - Tortuous colon with significant restricted                            mobility.                           - Procedure aborted just proximal to the surgical                            anastomosis - significant area of restriction /  angulated turn post operatively, did not feel the                            camera could safely get beyond this area. Patient                            also has a ventral hernia and in light of fair                            prep, procedure aborted                            - Two 2 to 3 mm polyps in the rectum, removed with                            a cold snare. Resected and retrieved.                           - Internal hemorrhoids.                           - The examination was otherwise normal. Recommendation:           - Patient has a contact number available for                            emergencies. The signs and symptoms of potential                            delayed complications were discussed with the                            patient. Return to normal activities tomorrow.                            Written discharge instructions were provided to the                            patient.                           - Resume previous diet.                           - Continue present medications.                           - Await pathology results.                           - Will discuss findings with the patient - adhesive                            disease is limiting ability to safely perform  complete colonoscopy. Surgical anastomosis appeared                            healthy. Elspeth P. Evangelene Vora, MD 10/11/2024 3:48:04 PM This report has been signed electronically.

## 2024-10-11 NOTE — Progress Notes (Signed)
 [facility] History and Physical   Primary Care Physician:  Katherine Toribio MATSU, MD   Reason for Procedure:   History of colon polyps  Plan:    colonoscopy     HPI: THEREASA Todd is a 59 y.o. 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 Here for surveillance colonoscopy  . Patient denies any bowel symptoms at this time. Otherwise feels well without any cardiopulmonary symptoms.   I have discussed risks / benefits of anesthesia and endoscopic procedure with Katherine Todd and they wish to proceed with the exams as outlined today.   The patient was provided an opportunity to ask questions and all were answered. The patient agreed with the plan.    Past Medical History:  Diagnosis Date   Allergy    Anemia    Anxiety    Arthritis    Cataract    Depression    Fibrocystic breast    Fibromyalgia    11/26/16 'resolved'   GERD (gastroesophageal reflux disease)    Hyperlipidemia    Hypothyroidism    Migraines    Obesity    Osteoporosis    Sleep apnea    not currently   Thyroid disease     Past Surgical History:  Procedure Laterality Date   BIOPSY  04/15/2023   Procedure: BIOPSY;  Surgeon: [facility]., MD;  [facility]: [facility];  Service: Gastroenterology;;   COLONOSCOPY WITH PROPOFOL  N/A 10/15/2022   Procedure: COLONOSCOPY WITH PROPOFOL ;  Surgeon: [facility]., MD;  [facility]: [facility];  Service: Gastroenterology;  Laterality: N/A;   COLONOSCOPY WITH PROPOFOL  N/A 04/15/2023   Procedure: COLONOSCOPY WITH PROPOFOL ;  Surgeon: [facility]., MD;  [facility];  Service: Gastroenterology;  Laterality: N/A;   ENDOSCOPIC MUCOSAL RESECTION N/A 10/15/2022   Procedure: ENDOSCOPIC MUCOSAL RESECTION;  Surgeon: [facility]., MD;  [facility]: [facility];  Service: Gastroenterology;  Laterality: N/A;   ESOPHAGOGASTRODUODENOSCOPY (EGD) WITH PROPOFOL  N/A 04/15/2023   Procedure: ESOPHAGOGASTRODUODENOSCOPY (EGD) WITH PROPOFOL ;  Surgeon: [facility]., MD;  [facility];  Service: Gastroenterology;  Laterality: N/A;   EUS N/A 10/15/2022   Procedure: LOWER ENDOSCOPIC ULTRASOUND (EUS);  Surgeon: [facility]., MD;  [facility]: [facility];  Service: Gastroenterology;  Laterality: N/A;   GASTRIC BYPASS  04/2007, revision 2009 and 2011   Mini gastric bypass   HEMOSTASIS CLIP PLACEMENT  10/15/2022   Procedure: HEMOSTASIS CLIP PLACEMENT;  Surgeon: [facility]., MD;  Location: [facility];  Service: Gastroenterology;;   HEMOSTASIS CONTROL  10/15/2022   Procedure: HEMOSTASIS CONTROL;  Surgeon: [facility]., MD;  [facility];  Service: Gastroenterology;;   HERNIA REPAIR  2009   incisional repair from gastsric bypass   LAPAROSCOPIC CHOLECYSTECTOMY  09/2002   Dr. Gretel GRUBER OF ADHESION N/A 08/19/2023   Procedure: LYSIS OF ADHESION;  Surgeon: Katherine Lonni HERO, MD;  [facility]: [facility];  Service: General;  Laterality: N/A;   POLYPECTOMY  10/15/2022   Procedure: POLYPECTOMY;  Surgeon: [facility]., MD;  [facility]: [facility];  Service: Gastroenterology;;   POLYPECTOMY  04/15/2023   Procedure: POLYPECTOMY;  Surgeon: [facility]., MD;  [facility]: [facility];  Service: Gastroenterology;;   TONSILLECTOMY AND ADENOIDECTOMY  age  88   WISDOM TOOTH EXTRACTION  age 71    Prior to Admission medications  Medication Sig Start Date End Date Taking? Authorizing Provider  acetaminophen  (TYLENOL ) 500 MG tablet Take 1,000 mg by mouth 2 (two) times daily.    [provider]  ARIPiprazole  (ABILIFY ) 5 MG tablet Take 2.5 mg by mouth in the morning.    [provider]  bisacodyl  (DULCOLAX) 5 MG EC tablet Take 5 mg by mouth once. 07/02/23   [provider]   buPROPion  (WELLBUTRIN  XL) 300 MG 24 hr tablet Take 300 mg by mouth in the morning. 03/18/14   [provider]  calcium  carbonate (TUMS - DOSED IN MG ELEMENTAL CALCIUM ) 500 MG chewable tablet Chew 1 tablet by mouth in the morning.    [provider]  cetirizine (ZYRTEC) 10 MG tablet Take 10 mg by mouth at bedtime. Patient not taking: Reported on 09/08/2024    [provider]  Cholecalciferol (VITAMIN D3 PO) Place 5,000 Units into both eyes in the morning.    [provider]  clonazePAM  (KLONOPIN ) 0.5 MG tablet Take 0.5 mg by mouth 2 (two) times daily as needed for anxiety.    [provider]  cyanocobalamin  (VITAMIN B12) 250 MCG tablet Take 250 mcg by mouth every evening. Patient not taking: Reported on 09/08/2024    [provider]  denosumab  (PROLIA ) 60 MG/ML SOSY injection Inject 60 mg into the skin every 6 (six) months.    [provider]  famotidine (PEPCID) 20 MG tablet Take 20 mg by mouth 2 (two) times daily. 09/25/19   [provider]  levothyroxine  (SYNTHROID ) 50 MCG tablet Take 50 mcg by mouth daily before breakfast. 02/19/22   [provider]  lovastatin (MEVACOR) 20 MG tablet Take 20 mg by mouth once a week.    [provider]  Multiple Vitamin (MULTIVITAMIN WITH MINERALS) TABS tablet Take 1 tablet by mouth in the morning and at bedtime. Patient not taking: Reported on 09/08/2024    [provider]  Multiple Vitamins-Minerals (OCUVITE EXTRA PO) Take 1 tablet by mouth at bedtime. Patient not taking: Reported on 09/08/2024    [provider]  neomycin  (MYCIFRADIN ) 500 MG tablet Take 1,000 mg by mouth 3 (three) times daily. 07/02/23   [provider]  omeprazole  (PRILOSEC) 20 MG capsule Take 1 capsule (20 mg total) by mouth daily. 09/08/24   [facility], Katherine SQUIBB, MD  ondansetron  (ZOFRAN -ODT) 4 MG disintegrating tablet Take 1 tablet (4 mg total) by mouth every 6 (six) hours as  needed for nausea or vomiting. 04/23/23   K[facility], Katherine SQUIBB, MD  OVER THE COUNTER MEDICATION Apply 1 application  topically 3 (three) times a week. Hairstim    [provider]  polycarbophil (FIBERCON) 625 MG tablet Take 625 mg by mouth 2 (two) times daily.    [provider]  propranolol (INDERAL) 10 MG tablet Take 1 tablet by mouth daily as needed (difficulty driving over bridges). Patient not taking: Reported on 09/08/2024 04/17/18   [provider]  sucralfate  (CARAFATE ) 1 g tablet Take 1 tablet (1 g total) by mouth every 6 (six) hours as needed. Dissolve 1 tablet in a TBSP of distilled water and drink as a slurry 01/21/24   Amisha Pospisil, Katherine SQUIBB, MD  SUMAtriptan  (IMITREX ) 100 MG tablet TAKE 1 TABLET BY MOUTH ONCE AS NEEDED FOR MIGRAINE(MAY REPEAT IN 2 HORUS. MAX OF 2 TABLETS PER 24 HOURS) 01/07/15   [provider]  tirzepatide  (MOUNJARO ) 10 MG/0.5ML Pen Inject 10 mg into the skin once a week. 09/15/24  traMADol  (ULTRAM ) 50 MG tablet Take 1 tablet (50 mg total) by mouth every 6 (six) hours as needed (postop pain not controlled with ibuprofen  first). Patient not taking: Reported on 09/08/2024 08/21/23   Ramirez, Armando, MD  Vitamin D, Ergocalciferol, (DRISDOL) 50000 UNITS CAPS Take 50,000 Units by mouth See admin instructions. Take 1 capsule twice daily on Mondays through Fridays.    [provider]  vitamin E 180 MG (400 UNITS) capsule Take 400 Units by mouth at bedtime. Patient not taking: Reported on 09/08/2024    [provider]  vortioxetine  HBr (TRINTELLIX ) 20 MG TABS tablet Take 20 mg by mouth in the morning.    [provider]    Current Outpatient Medications  Medication Sig Dispense Refill   acetaminophen  (TYLENOL ) 500 MG tablet Take 1,000 mg by mouth 2 (two) times daily.     ARIPiprazole  (ABILIFY ) 5 MG tablet Take 2.5 mg by mouth in the morning.     bisacodyl  (DULCOLAX) 5 MG EC tablet Take 5 mg by mouth once.     buPROPion   (WELLBUTRIN  XL) 300 MG 24 hr tablet Take 300 mg by mouth in the morning.     Cholecalciferol (VITAMIN D3 PO) Place 5,000 Units into both eyes in the morning.     famotidine (PEPCID) 20 MG tablet Take 20 mg by mouth 2 (two) times daily.     levothyroxine  (SYNTHROID ) 50 MCG tablet Take 50 mcg by mouth daily before breakfast.     lovastatin (MEVACOR) 20 MG tablet Take 20 mg by mouth once a week.     omeprazole  (PRILOSEC) 20 MG capsule Take 1 capsule (20 mg total) by mouth daily. 30 capsule 0   ondansetron  (ZOFRAN -ODT) 4 MG disintegrating tablet Take 1 tablet (4 mg total) by mouth every 6 (six) hours as needed for nausea or vomiting. 30 tablet 1   Vitamin D, Ergocalciferol, (DRISDOL) 50000 UNITS CAPS Take 50,000 Units by mouth See admin instructions. Take 1 capsule twice daily on Mondays through Fridays.     vortioxetine  HBr (TRINTELLIX ) 20 MG TABS tablet Take 20 mg by mouth in the morning.     calcium  carbonate (TUMS - DOSED IN MG ELEMENTAL CALCIUM ) 500 MG chewable tablet Chew 1 tablet by mouth in the morning.     cetirizine (ZYRTEC) 10 MG tablet Take 10 mg by mouth at bedtime. (Patient not taking: Reported on 09/08/2024)     clonazePAM  (KLONOPIN ) 0.5 MG tablet Take 0.5 mg by mouth 2 (two) times daily as needed for anxiety.     cyanocobalamin  (VITAMIN B12) 250 MCG tablet Take 250 mcg by mouth every evening. (Patient not taking: Reported on 09/08/2024)     denosumab  (PROLIA ) 60 MG/ML SOSY injection Inject 60 mg into the skin every 6 (six) months.     Multiple Vitamin (MULTIVITAMIN WITH MINERALS) TABS tablet Take 1 tablet by mouth in the morning and at bedtime. (Patient not taking: Reported on 09/08/2024)     Multiple Vitamins-Minerals (OCUVITE EXTRA PO) Take 1 tablet by mouth at bedtime. (Patient not taking: Reported on 09/08/2024)     neomycin  (MYCIFRADIN ) 500 MG tablet Take 1,000 mg by mouth 3 (three) times daily.     OVER THE COUNTER MEDICATION Apply 1 application  topically 3 (three) times a week.  Hairstim     polycarbophil (FIBERCON) 625 MG tablet Take 625 mg by mouth 2 (two) times daily.     propranolol (INDERAL) 10 MG tablet Take 1 tablet by mouth daily as needed (difficulty driving  over bridges). (Patient not taking: Reported on 09/08/2024)  12   sucralfate  (CARAFATE ) 1 g tablet Take 1 tablet (1 g total) by mouth every 6 (six) hours as needed. Dissolve 1 tablet in a TBSP of distilled water and drink as a slurry 120 tablet 5   SUMAtriptan  (IMITREX ) 100 MG tablet TAKE 1 TABLET BY MOUTH ONCE AS NEEDED FOR MIGRAINE(MAY REPEAT IN 2 HORUS. MAX OF 2 TABLETS PER 24 HOURS)     tirzepatide  (MOUNJARO ) 10 MG/0.5ML Pen Inject 10 mg into the skin once a week. 2 mL 5   traMADol  (ULTRAM ) 50 MG tablet Take 1 tablet (50 mg total) by mouth every 6 (six) hours as needed (postop pain not controlled with ibuprofen  first). (Patient not taking: Reported on 09/08/2024) 30 tablet 0   vitamin E 180 MG (400 UNITS) capsule Take 400 Units by mouth at bedtime. (Patient not taking: Reported on 09/08/2024)     Current Facility-Administered Medications  Medication Dose Route Frequency Provider Last Rate Last Admin   0.9 %  sodium chloride  infusion  500 mL Intravenous Once Stanislaw Acton, Katherine SQUIBB, MD        Allergies as of 10/11/2024 - Review Complete 10/11/2024  Allergen Reaction Noted   Adhesive [tape] Rash 02/21/2013   Albertsons diphedryl [diphenhydramine ] Other (See Comments) 04/02/2015   Atorvastatin Other (See Comments) 04/02/2015    Family History  Problem Relation Age of Onset   Hyperlipidemia Mother    Prostate cancer Father 107   Thyroid disease Father    Melanoma Brother 35       back of knee    Stroke Maternal Grandmother    Colon cancer Maternal Grandfather 60       colon caner   Pancreatic cancer Paternal Grandmother            Breast cancer Neg Hx    Esophageal cancer Neg Hx    Stomach cancer Neg Hx    Rectal cancer Neg Hx    Inflammatory bowel disease Neg Hx    Liver disease Neg Hx      Social History   Socioeconomic History   Marital status: Single    Spouse name: Not on file   Number of children: 0   Years of education: 20   Highest education level: Not on file  Occupational History    Comment: works for social worker firm  Tobacco Use   Smoking status: Never   Smokeless tobacco: Never  Vaping Use   Vaping status: Never Used  Substance and Sexual Activity   Alcohol use: No   Drug use: No   Sexual activity: Not Currently    Birth control/protection: Post-menopausal  Other Topics Concern   Not on file  Social History Narrative   Lives alone   Caffeine- coffee, 2-3 cups daily, soda 1 daily   Social Drivers of Health   Tobacco Use: Low Risk (10/11/2024)   Patient History    Smoking Tobacco Use: Never    Smokeless Tobacco Use: Never    Passive Exposure: Not on file  Financial Resource Strain: Not on file  Food Insecurity: No Food Insecurity (08/20/2023)   Hunger Vital Sign    Worried About Running Out of Food in the Last Year: Never true    Ran Out of Food in the Last Year: Never true  Transportation Needs: No Transportation Needs (08/20/2023)   PRAPARE - Administrator, Civil Service (Medical): No    Lack of Transportation (Non-Medical): No  Physical Activity:  Not on file  Stress: Not on file  Social Connections: Not on file  Intimate Partner Violence: Not At Risk (08/20/2023)   Humiliation, Afraid, Rape, and Kick questionnaire    Fear of Current or Ex-Partner: No    Emotionally Abused: No    Physically Abused: No    Sexually Abused: No  Depression (PHQ2-9): Low Risk (06/06/2024)   Depression (PHQ2-9)    PHQ-2 Score: 0  Alcohol Screen: Not on file  Housing: Unknown (09/20/2023)   [facility]   Epic    Unable to Pay for Housing in the Last Year: Not on file    Number of Times Moved in the Last Year: Not on file    At any time in the past 12 months, were you homeless or living in a shelter (including now)?: No   Utilities: Not At Risk (08/20/2023)   [facility] Utilities    Threatened with loss of utilities: No  Health Literacy: Not on file    Review of Systems: All other review of systems negative except as mentioned in the HPI.  Physical Exam: Vital signs BP (!) 139/92   Pulse 90   Temp (!) 97.5 F (36.4 C)   Resp 20   Ht 5' 4 (1.626 m)   Wt 156 lb (70.8 kg)   LMP 09/14/2010   SpO2 100%   BMI 26.78 kg/m   General:   Alert,  Well-developed, pleasant and cooperative in NAD Lungs:  Clear throughout to auscultation.   Heart:  Regular rate and rhythm Abdomen:  Soft, nontender and nondistended.   Neuro/Psych:  Alert and cooperative. Normal mood and affect. A and O x 3  Marcey Naval, MD [facility]

## 2024-10-11 NOTE — Progress Notes (Signed)
 Report given to PACU, vss

## 2024-10-12 ENCOUNTER — Telehealth: Payer: Self-pay | Admitting: *Deleted

## 2024-10-12 ENCOUNTER — Encounter: Payer: Self-pay | Admitting: Gastroenterology

## 2024-10-12 MED ORDER — OMEPRAZOLE 20 MG PO CPDR: 20.0000 mg | DELAYED_RELEASE_CAPSULE | Freq: Every day | ORAL | 1 refills | Status: AC

## 2024-10-12 NOTE — Telephone Encounter (Signed)
" °  Follow up Call-     10/11/2024    3:02 PM 06/10/2023    2:58 PM 07/31/2022    7:46 AM  Call back number  Post procedure Call Back phone  # [phone]  Permission to leave phone message Yes Yes Yes     Patient questions:  Do you have a fever, pain , or abdominal swelling? No. Pain Score  0 *  Have you tolerated food without any problems? Yes.    Have you been able to return to your normal activities? Yes.    Do you have any questions about your discharge instructions: Diet   No. Medications  No. Follow up visit  No.  Do you have questions or concerns about your Care? No.  Actions: * If pain score is 4 or above: No action needed, pain <4.   "

## 2024-10-16 ENCOUNTER — Ambulatory Visit: Payer: Self-pay | Admitting: Gastroenterology

## 2024-10-16 LAB — SURGICAL PATHOLOGY

## 2024-10-19 ENCOUNTER — Other Ambulatory Visit: Payer: Self-pay

## 2024-10-20 ENCOUNTER — Other Ambulatory Visit: Payer: Self-pay

## 2025-01-12 ENCOUNTER — Encounter (HOSPITAL_COMMUNITY)

## 2025-06-11 ENCOUNTER — Ambulatory Visit (HOSPITAL_BASED_OUTPATIENT_CLINIC_OR_DEPARTMENT_OTHER): Admitting: Obstetrics & Gynecology
